# Patient Record
Sex: Female | Born: 1955 | Hispanic: Yes | Marital: Married | Smoking: Never smoker
Health system: Southern US, Community
[De-identification: ages and names within clinical notes are randomized; demographics above are authoritative.]

## PROBLEM LIST (undated history)

## (undated) DIAGNOSIS — E7800 Pure hypercholesterolemia, unspecified: Secondary | ICD-10-CM

## (undated) DIAGNOSIS — H9190 Unspecified hearing loss, unspecified ear: Secondary | ICD-10-CM

## (undated) DIAGNOSIS — E785 Hyperlipidemia, unspecified: Secondary | ICD-10-CM

## (undated) DIAGNOSIS — K2970 Gastritis, unspecified, without bleeding: Secondary | ICD-10-CM

## (undated) DIAGNOSIS — M1990 Unspecified osteoarthritis, unspecified site: Secondary | ICD-10-CM

## (undated) DIAGNOSIS — N390 Urinary tract infection, site not specified: Secondary | ICD-10-CM

## (undated) DIAGNOSIS — I10 Essential (primary) hypertension: Secondary | ICD-10-CM

## (undated) DIAGNOSIS — F419 Anxiety disorder, unspecified: Secondary | ICD-10-CM

## (undated) DIAGNOSIS — E079 Disorder of thyroid, unspecified: Secondary | ICD-10-CM

## (undated) DIAGNOSIS — R001 Bradycardia, unspecified: Secondary | ICD-10-CM

## (undated) HISTORY — DX: Anxiety disorder, unspecified: F41.9

## (undated) HISTORY — PX: CHOLECYSTECTOMY: SHX55

## (undated) HISTORY — DX: Unspecified hearing loss, unspecified ear: H91.90

## (undated) HISTORY — DX: Hyperlipidemia, unspecified: E78.5

## (undated) HISTORY — PX: ROTATOR CUFF REPAIR: SHX139

---

## 2020-06-15 ENCOUNTER — Other Ambulatory Visit: Payer: Self-pay | Admitting: Student

## 2020-06-15 DIAGNOSIS — R42 Dizziness and giddiness: Secondary | ICD-10-CM

## 2020-06-15 DIAGNOSIS — R0989 Other specified symptoms and signs involving the circulatory and respiratory systems: Secondary | ICD-10-CM

## 2020-06-27 ENCOUNTER — Other Ambulatory Visit: Payer: Self-pay

## 2020-07-06 ENCOUNTER — Telehealth: Payer: Self-pay

## 2020-07-06 NOTE — Telephone Encounter (Signed)
NOTES ON FILE FROM [facility]... [phone] SENT REFERRAL TO SCHEDULING

## 2020-07-12 ENCOUNTER — Ambulatory Visit
Admission: RE | Admit: 2020-07-12 | Discharge: 2020-07-12 | Disposition: A | Discharge status: Home / Self Care | Payer: Medicare Other | Source: Ambulatory Visit | Attending: Student | Admitting: Student

## 2020-07-12 DIAGNOSIS — R42 Dizziness and giddiness: Secondary | ICD-10-CM

## 2020-07-12 DIAGNOSIS — R0989 Other specified symptoms and signs involving the circulatory and respiratory systems: Secondary | ICD-10-CM

## 2020-08-16 ENCOUNTER — Other Ambulatory Visit: Payer: Self-pay

## 2020-08-16 ENCOUNTER — Emergency Department (HOSPITAL_BASED_OUTPATIENT_CLINIC_OR_DEPARTMENT_OTHER): Payer: Medicare Other

## 2020-08-16 ENCOUNTER — Emergency Department (HOSPITAL_BASED_OUTPATIENT_CLINIC_OR_DEPARTMENT_OTHER)
Admission: EM | Admit: 2020-08-16 | Discharge: 2020-08-16 | Disposition: A | Discharge status: Home / Self Care | Payer: Medicare Other | Attending: Emergency Medicine | Admitting: Emergency Medicine

## 2020-08-16 ENCOUNTER — Encounter (HOSPITAL_BASED_OUTPATIENT_CLINIC_OR_DEPARTMENT_OTHER): Payer: Self-pay

## 2020-08-16 DIAGNOSIS — Z87440 Personal history of urinary (tract) infections: Secondary | ICD-10-CM | POA: Insufficient documentation

## 2020-08-16 DIAGNOSIS — U071 COVID-19: Secondary | ICD-10-CM | POA: Diagnosis not present

## 2020-08-16 DIAGNOSIS — I10 Essential (primary) hypertension: Secondary | ICD-10-CM | POA: Diagnosis not present

## 2020-08-16 DIAGNOSIS — Z885 Allergy status to narcotic agent status: Secondary | ICD-10-CM | POA: Diagnosis not present

## 2020-08-16 DIAGNOSIS — R079 Chest pain, unspecified: Secondary | ICD-10-CM

## 2020-08-16 DIAGNOSIS — Z9049 Acquired absence of other specified parts of digestive tract: Secondary | ICD-10-CM | POA: Diagnosis not present

## 2020-08-16 DIAGNOSIS — R531 Weakness: Secondary | ICD-10-CM | POA: Insufficient documentation

## 2020-08-16 DIAGNOSIS — R0602 Shortness of breath: Secondary | ICD-10-CM | POA: Diagnosis present

## 2020-08-16 HISTORY — DX: Disorder of thyroid, unspecified: E07.9

## 2020-08-16 HISTORY — DX: Pure hypercholesterolemia, unspecified: E78.00

## 2020-08-16 HISTORY — DX: Essential (primary) hypertension: I10

## 2020-08-16 HISTORY — DX: Gastritis, unspecified, without bleeding: K29.70

## 2020-08-16 HISTORY — DX: Bradycardia, unspecified: R00.1

## 2020-08-16 HISTORY — DX: Urinary tract infection, site not specified: N39.0

## 2020-08-16 HISTORY — DX: Unspecified osteoarthritis, unspecified site: M19.90

## 2020-08-16 LAB — URINALYSIS, MICROSCOPIC (REFLEX)

## 2020-08-16 LAB — BASIC METABOLIC PANEL
Anion gap: 12 (ref 5–15)
BUN: 15 mg/dL (ref 8–23)
CO2: 26 mmol/L (ref 22–32)
Calcium: 8.5 mg/dL — ABNORMAL LOW (ref 8.9–10.3)
Chloride: 97 mmol/L — ABNORMAL LOW (ref 98–111)
Creatinine, Ser: 0.61 mg/dL (ref 0.44–1.00)
GFR, Estimated: >60 mL/min (ref >60)
Glucose, Bld: 103 mg/dL — ABNORMAL HIGH (ref 70–99)
Potassium: 3.1 mmol/L — ABNORMAL LOW (ref 3.5–5.1)
Sodium: 135 mmol/L (ref 135–145)

## 2020-08-16 LAB — CBC
HCT: 39.9 % (ref 36.0–46.0)
Hemoglobin: 13.2 g/dL (ref 12.0–15.0)
MCH: 27.3 pg (ref 26.0–34.0)
MCHC: 33.1 g/dL (ref 30.0–36.0)
MCV: 82.6 fL (ref 80.0–100.0)
Platelets: 248 10*3/uL (ref 150–400)
RBC: 4.83 MIL/uL (ref 3.87–5.11)
RDW: 14.1 % (ref 11.5–15.5)
WBC: 4.8 10*3/uL (ref 4.0–10.5)
nRBC: 0 % (ref 0.0–0.2)

## 2020-08-16 LAB — URINALYSIS, ROUTINE W REFLEX MICROSCOPIC
Bilirubin Urine: NEGATIVE
Glucose, UA: NEGATIVE mg/dL
Hgb urine dipstick: NEGATIVE
Ketones, ur: NEGATIVE mg/dL
Leukocytes,Ua: NEGATIVE
Nitrite: POSITIVE — AB
Protein, ur: NEGATIVE mg/dL
Specific Gravity, Urine: 1.01 (ref 1.005–1.030)
pH: 6 (ref 5.0–8.0)

## 2020-08-16 LAB — TROPONIN I (HIGH SENSITIVITY): Troponin I (High Sensitivity): 10 ng/L (ref <18)

## 2020-08-16 LAB — CBG MONITORING, ED
Glucose-Capillary: 96 mg/dL (ref 70–99)
Glucose-Capillary: 86 mg/dL (ref 70–99)

## 2020-08-16 MED ORDER — SODIUM CHLORIDE 0.9 % IV SOLN
1.0000 g | Freq: Once | INTRAVENOUS | Status: AC
  Administered 2020-08-16: 1 g via INTRAVENOUS
  Filled 2020-08-16: qty 10

## 2020-08-16 MED ORDER — ACETAMINOPHEN 500 MG PO TABS
1000.0000 mg | ORAL_TABLET | Freq: Once | ORAL | Status: AC
  Administered 2020-08-16: 1000 mg via ORAL
  Filled 2020-08-16: qty 2

## 2020-08-16 NOTE — ED Triage Notes (Signed)
Pt covid positive reports that she is having "nervousness" in her abd. Only cough. Pt has had this feeling since Monday and tested positive yesterday. PCP sent Pt here for blood work evaluation prior to prescribing Pt with anxiety medications. Pt reports ongoing dizziness and weakness.

## 2020-08-16 NOTE — Discharge Instructions (Signed)
You can take 600 mg of ibuprofen every 6 hours, you can take 1000 mg of Tylenol every 6 hours, you can alternate these every 3 or you can take them together.  

## 2020-08-16 NOTE — ED Notes (Signed)
Reassess due to being sleepy.  CBG wnl.  BP elevated.  Per daughter, patient tested positive for covid yesterday.

## 2020-08-16 NOTE — ED Provider Notes (Signed)
[facility] EMERGENCY DEPARTMENT Provider Note   CSN: [id number] Arrival date & time: 08/16/20  1314     History Chief Complaint  Patient presents with  . Covid Positive  . Weakness    Courtney Moon is a 65 y.o. female.   Weakness Severity:  Moderate Onset quality:  Gradual Duration:  1 week Timing:  Constant Progression:  Waxing and waning Chronicity:  New Context comment:  Covid positive Relieved by:  Nothing Worsened by:  Nothing Ineffective treatments:  None tried Associated symptoms: anorexia, chest pain, myalgias and shortness of breath   Associated symptoms: no arthralgias, no cough, no diarrhea, no dysuria, no fever, no headaches, no nausea and no vomiting        Past Medical History:  Diagnosis Date  . Arthritis   . Bradycardia   . Chronic UTI   . Gastritis   . Hypercholesteremia   . Hypertension   . Orthostatic hypertension   . Thyroid disorder     There are no problems to display for this patient.   Past Surgical History:  Procedure Laterality Date  . CHOLECYSTECTOMY    . ROTATOR CUFF REPAIR       OB History   No obstetric history on file.     History reviewed. No pertinent family history.  Social History   Tobacco Use  . Smoking status: Never Smoker  . Smokeless tobacco: Never Used  Vaping Use  . Vaping Use: Never used  Substance Use Topics  . Alcohol use: Never  . Drug use: Never    Home Medications Prior to Admission medications   Not on File    Allergies    Oxycodone  Review of Systems   Review of Systems  Constitutional: Positive for activity change and appetite change. Negative for chills and fever.  HENT: Negative for congestion and rhinorrhea.   Respiratory: Positive for shortness of breath. Negative for cough.   Cardiovascular: Positive for chest pain. Negative for palpitations.  Gastrointestinal: Positive for anorexia. Negative for diarrhea, nausea and vomiting.  Genitourinary: Negative for  difficulty urinating and dysuria.  Musculoskeletal: Positive for myalgias. Negative for arthralgias and back pain.  Skin: Negative for rash and wound.  Neurological: Positive for weakness. Negative for light-headedness and headaches.  Psychiatric/Behavioral: The patient is nervous/anxious.     Physical Exam Updated Vital Signs BP (!) 148/82 (BP Location: Left Arm)   Pulse 63   Temp 98.7 F (37.1 C) (Oral)   Resp 15   Ht 5' (1.524 m)   Wt 97.1 kg   SpO2 97%   BMI 41.79 kg/m   Physical Exam Vitals and nursing note reviewed. Exam conducted with a chaperone present.  Constitutional:      General: She is not in acute distress.    Appearance: Normal appearance.  HENT:     Head: Normocephalic and atraumatic.     Nose: No rhinorrhea.  Eyes:     General:        Right eye: No discharge.        Left eye: No discharge.     Conjunctiva/sclera: Conjunctivae normal.  Cardiovascular:     Rate and Rhythm: Normal rate and regular rhythm.     Heart sounds: No murmur heard. No gallop.   Pulmonary:     Effort: Pulmonary effort is normal. No respiratory distress.     Breath sounds: No stridor. No wheezing, rhonchi or rales.  Abdominal:     General: Abdomen is flat. There is no distension.  Palpations: Abdomen is soft.     Tenderness: There is no abdominal tenderness.  Musculoskeletal:        General: No tenderness or signs of injury.  Skin:    General: Skin is warm and dry.  Neurological:     General: No focal deficit present.     Mental Status: She is alert. Mental status is at baseline.     Motor: No weakness.  Psychiatric:        Mood and Affect: Mood normal.        Behavior: Behavior normal.     ED Results / Procedures / Treatments   Labs (all labs ordered are listed, but only abnormal results are displayed) Labs Reviewed  BASIC METABOLIC PANEL - Abnormal; Notable for the following components:      Result Value   Potassium 3.1 (*)    Chloride 97 (*)    Glucose, Bld  103 (*)    Calcium 8.5 (*)    All other components within normal limits  URINALYSIS, ROUTINE W REFLEX MICROSCOPIC - Abnormal; Notable for the following components:   Nitrite POSITIVE (*)    All other components within normal limits  URINALYSIS, MICROSCOPIC (REFLEX) - Abnormal; Notable for the following components:   Bacteria, UA MANY (*)    All other components within normal limits  URINE CULTURE  CBC  CBG MONITORING, ED  CBG MONITORING, ED  TROPONIN I (HIGH SENSITIVITY)  TROPONIN I (HIGH SENSITIVITY)    EKG EKG Interpretation  Date/Time:  Thursday August 16 2020 13:57:31 EST Ventricular Rate:  62 PR Interval:  170 QRS Duration: 86 QT Interval:  448 QTC Calculation: 454 R Axis:   43 Text Interpretation: Normal sinus rhythm T wave abnormality, consider lateral ischemia Abnormal ECG No old tracing to compare Confirmed by Susy Frizzle [phone]) on 08/16/2020 2:55:42 PM   Radiology DG Chest Portable 1 View  Result Date: 08/16/2020 CLINICAL DATA:  Per ED notes: Pt covid positive reports that she is having "nervousness" in her abd. Only cough. Pt has had this feeling since Monday and tested positive yesterday. PCP sent Pt here for blood work evaluation prior to prescribing Pt with anxiety medications. Pt reports ongoing dizziness and weakness. EXAM: PORTABLE CHEST 1 VIEW COMPARISON:  None. FINDINGS: Cardiac silhouette is normal in size. No mediastinal or hilar masses or evidence of adenopathy. Lungs are clear. No evidence of a pleural effusion.  No pneumothorax. Postoperative tapering of the distal right clavicle. No acute skeletal abnormality. IMPRESSION: No active disease. Electronically Signed   By: Amie Portland M.D.   On: 08/16/2020 18:40    Procedures Procedures (including critical care time)  Medications Ordered in ED Medications  acetaminophen (TYLENOL) tablet 1,000 mg (has no administration in time range)  cefTRIAXone (ROCEPHIN) 1 g in sodium chloride 0.9 % 100 mL IVPB  (1 g Intravenous New Bag/Given 08/16/20 1912)    ED Course  I have reviewed the triage vital signs and the nursing notes.  Pertinent labs & imaging results that were available during my care of the patient were reviewed by me and considered in my medical decision making (see chart for details).    MDM Rules/Calculators/A&P                          65 year old female COVID-positive, comes in with intermittent episodes of chest pain.  Not exertional.  Not reproducible on exam.  Likely related to COVID but patient is very anxious called  her doctor her doctor told her she needs to get evaluated emergency room.  Her EKG shows sinus rhythm nonspecific T wave changes.  No prior to compare to.  Shows no acute cardiopulmonary pathology.  Laboratory studies show no significant electrolyte derangements no significant bloodline abnormalities.  Urinalysis shows nitrites and many bacteria.  She does get frequent UTIs.  We will treat with Rocephin and give prescription for Keflex.  Urine culture will be sent.  We will get a one-time troponin as she has had intermittent symptoms for several days.  Low likelihood of coronary artery syndrome the patient is requesting discharge however understands further work-up is needed before she is safe for follow-up with her primary care provider.  Spanish-speaking interpreter was offered but declined by family, they prefer to use family members.  Trop is negative and pt is resting comfortably.  Has mild headache, many other symptoms related to COVID, safer discharge home, strict return precautions given told to follow-up with primary care provider.  Pt is safe for DC home with outpatient follow up. The patient and family agrees with the plan and has no other questions or concerns.  Final Clinical Impression(s) / ED Diagnoses Final diagnoses:  COVID  Chest pain, unspecified type    Rx / DC Orders ED Discharge Orders    None       Sabino Donovan, MD 08/16/20 2014

## 2020-08-19 LAB — URINE CULTURE: Culture: >=100000 — AB

## 2020-08-19 NOTE — Progress Notes (Deleted)
Cardiology Office Note:    Date:  08/19/2020   ID:  Graycen Degan, DOB May 05, 1956, MRN [id number]  PCP:  Hillery Aldo, NP  [facility] Cardiologist:  No primary care provider on file.  [facility] Electrophysiologist:  None   Referring MD: Hillery Aldo, NP    History of Present Illness:    Kailan Carmen is a 65 y.o. female with a hx of HTN, HLD, and recent COVID-19 infection on 08/16/20 who was referred by Hillery Aldo, NP for further evaluation of chest pain.    Past Medical History:  Diagnosis Date  . Arthritis   . Bradycardia   . Chronic UTI   . Gastritis   . Hypercholesteremia   . Hypertension   . Orthostatic hypertension   . Thyroid disorder     Past Surgical History:  Procedure Laterality Date  . CHOLECYSTECTOMY    . ROTATOR CUFF REPAIR      Current Medications: No outpatient medications have been marked as taking for the 08/28/20 encounter (Appointment) with Meriam Sprague, MD.     Allergies:   Oxycodone   Social History   Socioeconomic History  . Marital status: Married    Spouse name: Not on file  . Number of children: Not on file  . Years of education: Not on file  . Highest education level: Not on file  Occupational History  . Not on file  Tobacco Use  . Smoking status: Never Smoker  . Smokeless tobacco: Never Used  Vaping Use  . Vaping Use: Never used  Substance and Sexual Activity  . Alcohol use: Never  . Drug use: Never  . Sexual activity: Not on file  Other Topics Concern  . Not on file  Social History Narrative  . Not on file   Social Determinants of Health   Financial Resource Strain: Not on file  Food Insecurity: Not on file  Transportation Needs: Not on file  Physical Activity: Not on file  Stress: Not on file  Social Connections: Not on file     Family History: The patient's ***family history is not on file.  ROS:   Please see the history of present illness.    *** All other systems reviewed and are  negative.  EKGs/Labs/Other Studies Reviewed:    The following studies were reviewed today: ***  EKG:  EKG is *** ordered today.  The ekg ordered today demonstrates ***  Recent Labs: 08/16/2020: BUN 15; Creatinine, Ser 0.61; Hemoglobin 13.2; Platelets 248; Potassium 3.1; Sodium 135  Recent Lipid Panel No results found for: CHOL, TRIG, HDL, CHOLHDL, VLDL, LDLCALC, LDLDIRECT   Risk Assessment/Calculations:   {Does this patient have ATRIAL FIBRILLATION?[phone]}   Physical Exam:    VS:  There were no vitals taken for this visit.    Wt Readings from Last 3 Encounters:  08/16/20 214 lb (97.1 kg)     GEN: *** Well nourished, well developed in no acute distress HEENT: Normal NECK: No JVD; No carotid bruits LYMPHATICS: No lymphadenopathy CARDIAC: ***RRR, no murmurs, rubs, gallops RESPIRATORY:  Clear to auscultation without rales, wheezing or rhonchi  ABDOMEN: Soft, non-tender, non-distended MUSCULOSKELETAL:  No edema; No deformity  SKIN: Warm and dry NEUROLOGIC:  Alert and oriented x 3 PSYCHIATRIC:  Normal affect   ASSESSMENT:    No diagnosis found. PLAN:    In order of problems listed above:  1. ***   {Are you ordering a CV Procedure (e.g. stress test, cath, DCCV, TEE, etc)?   Press F2        :  [id number]}    Medication Adjustments/Labs and Tests Ordered: Current medicines are reviewed at length with the patient today.  Concerns regarding medicines are outlined above.  No orders of the defined types were placed in this encounter.  No orders of the defined types were placed in this encounter.   There are no Patient Instructions on file for this visit.   Signed, Meriam Sprague, MD  08/19/2020 8:23 AM    [facility]

## 2020-08-20 NOTE — Progress Notes (Signed)
ED Antimicrobial Stewardship Positive Culture Follow Up   Courtney Moon is an 65 y.o. female who presented to [facility] on 08/16/2020 with a chief complaint of  Chief Complaint  Patient presents with   Covid Positive   Weakness    Recent Results (from the past 720 hour(s))  Urine culture     Status: Abnormal   Collection Time: 08/16/20  2:18 PM   Specimen: Urine, Random  Result Value Ref Range Status   Specimen Description   Final    URINE, RANDOM Performed at [facility], [address] [place], [place] [address]    Special Requests   Final    NONE Performed at [facility], [address] [place], [place] [address]    Culture >=100,000 COLONIES/mL ESCHERICHIA COLI (A)  Final   Report Status 08/19/2020 FINAL  Final   Organism ID, Bacteria ESCHERICHIA COLI (A)  Final      Susceptibility   Escherichia coli - MIC*    AMPICILLIN >=32 RESISTANT Resistant     CEFAZOLIN <=4 SENSITIVE Sensitive     CEFEPIME <=0.12 SENSITIVE Sensitive     CEFTRIAXONE <=0.25 SENSITIVE Sensitive     CIPROFLOXACIN <=0.25 SENSITIVE Sensitive     GENTAMICIN >=16 RESISTANT Resistant     IMIPENEM <=0.25 SENSITIVE Sensitive     NITROFURANTOIN <=16 SENSITIVE Sensitive     TRIMETH/SULFA >=320 RESISTANT Resistant     AMPICILLIN/SULBACTAM 4 SENSITIVE Sensitive     PIP/TAZO <=4 SENSITIVE Sensitive     * >=100,000 COLONIES/mL ESCHERICHIA COLI    [x]  Patient discharged originally without antimicrobial agent and treatment may now be indicated  New antibiotic prescription: Flow Manager to call patient. If patient feeling better, no further treatment indicated. If patient still experiencing symptoms, start cephalexin 500mg  PO q6h x 5 days.  ED Provider: , PA-C   [place] 08/20/2020, 8:17 AM Clinical Pharmacist Monday - Friday phone -  [phone] Saturday - Sunday phone - [phone]

## 2020-08-28 ENCOUNTER — Ambulatory Visit: Payer: Medicare Other | Admitting: Cardiology

## 2020-09-24 ENCOUNTER — Ambulatory Visit: Payer: Medicare Other | Admitting: Neurology

## 2020-10-16 ENCOUNTER — Ambulatory Visit: Payer: Medicare Other | Admitting: Cardiology

## 2020-10-25 NOTE — Progress Notes (Unsigned)
Cardiology Office Note:    Date:  10/26/2020   ID:  Courtney Moon, DOB 01/10/1956, MRN [id number]  PCP:  Hillery AldoStowe, Shanna, NP   [facility]  Cardiologist:  No primary care provider on file.  Advanced Practice Provider:  No care team member to display Electrophysiologist:  None    Referring MD: Hillery AldoStowe, Shanna, NP   History of Present Illness:    Courtney Moon is a 65 y.o. female with a hx of HTN, HLD, carotid artery disease and orthostatic hypotension who was referred by Hillery AldoShanna Stowe, NP for further evaluation of chest pain.  The patient had COVID in 08/2020 where she was not behaving like herself and got very agitated and confused and started taking off her clothing. EMS was called and [facility] where ECG was normal, trop negative, CXR without acute pathology. She was discharged home.   Since that time, she has suffered from significant anxiety, SOB and episodes of chest pain. Her chest discomfort and breathing symptoms usually are worsened with anxiety but can occur with exertion as well.  Notably, was followed by Cardiology in [place] where stress test in 2019 was normal; TTE in 2019 with normal LVEF 56%., mild TR. No known coronary disease.   She also has been having worsening dizziness. She underwent carotid ultrasound which showed >70% narrowing in the left ICA. She is planned to see Vascular Surgery. Also has left leg numbness and is planned to see Neurology for further management.   Past Medical History:  Diagnosis Date  . Arthritis   . Bradycardia   . Chronic UTI   . Gastritis   . Hypercholesteremia   . Hypertension   . Orthostatic hypertension   . Thyroid disorder     Past Surgical History:  Procedure Laterality Date  . CHOLECYSTECTOMY    . ROTATOR CUFF REPAIR      Current Medications: Current Meds  Medication Sig  . aspirin 81 MG EC tablet Take 81 mg by mouth every other day.  Marland Kitchen. atorvastatin (LIPITOR) 10 MG tablet Take 10 mg by mouth  daily.  . carvedilol (COREG) 3.125 MG tablet Take 3.125 mg by mouth 2 (two) times daily.  Marland Kitchen. ezetimibe (ZETIA) 10 MG tablet Take 10 mg by mouth daily.  Marland Kitchen. levothyroxine (SYNTHROID) 50 MCG tablet Take 50 mcg by mouth daily.  Marland Kitchen. losartan (COZAAR) 50 MG tablet Take 50 mg by mouth daily.  . pantoprazole (PROTONIX) 40 MG tablet Take 40 mg by mouth daily.     Allergies:   Oxycodone   Social History   Socioeconomic History  . Marital status: Married    Spouse name: Not on file  . Number of children: Not on file  . Years of education: Not on file  . Highest education level: Not on file  Occupational History  . Not on file  Tobacco Use  . Smoking status: Never Smoker  . Smokeless tobacco: Never Used  Vaping Use  . Vaping Use: Never used  Substance and Sexual Activity  . Alcohol use: Never  . Drug use: Never  . Sexual activity: Not on file  Other Topics Concern  . Not on file  Social History Narrative  . Not on file   Social Determinants of Health   Financial Resource Strain: Not on file  Food Insecurity: Not on file  Transportation Needs: Not on file  Physical Activity: Not on file  Stress: Not on file  Social Connections: Not on file  Family History: The patient's family history is not on file.  ROS:   Please see the history of present illness.    Review of Systems  Constitutional: Positive for malaise/fatigue. Negative for fever.  HENT: Negative for hearing loss.   Eyes: Negative for blurred vision and redness.  Respiratory: Positive for cough and shortness of breath.   Cardiovascular: Positive for chest pain. Negative for palpitations, orthopnea, claudication, leg swelling and PND.  Gastrointestinal: Negative for melena, nausea and vomiting.  Genitourinary: Negative for dysuria and flank pain.  Musculoskeletal: Positive for myalgias. Negative for falls.  Neurological: Positive for dizziness, tingling and sensory change. Negative for loss of consciousness.   Endo/Heme/Allergies: Negative for polydipsia.  Psychiatric/Behavioral: The patient is nervous/anxious.     EKGs/Labs/Other Studies Reviewed:    The following studies were reviewed today: Carotid ultrasound 07/12/20: IMPRESSION: Left ICA stenosis estimated at greater than 70%.  Right ICA narrowing, less than 50%.  Normal antegrade vertebral flow bilaterally   EKG:  EKG 08/17/20: NSR with non-specific ST-T wave changes in the lateral leads  Recent Labs: 08/16/2020: BUN 15; Creatinine, Ser 0.61; Hemoglobin 13.2; Platelets 248; Potassium 3.1; Sodium 135  Recent Lipid Panel No results found for: CHOL, TRIG, HDL, CHOLHDL, VLDL, LDLCALC, LDLDIRECT    Physical Exam:    VS:  BP 140/80 (BP Location: Left Arm, Patient Position: Sitting, Cuff Size: Normal)   Pulse 78   Ht 5' (1.524 m)   Wt 210 lb (95.3 kg)   SpO2 98%   BMI 41.01 kg/m     Wt Readings from Last 3 Encounters:  10/26/20 210 lb (95.3 kg)  08/16/20 214 lb (97.1 kg)     GEN:  Well nourished, well developed in no acute distress HEENT: Normal NECK: No JVD; No carotid bruits CARDIAC: RRR, no murmurs, rubs, gallops RESPIRATORY:  Clear to auscultation without rales, wheezing or rhonchi  ABDOMEN: Soft, non-tender, non-distended MUSCULOSKELETAL:  No edema; No deformity  SKIN: Warm and dry NEUROLOGIC:  Alert and oriented x 3 PSYCHIATRIC:  Normal affect   ASSESSMENT:    1. Dyspnea on exertion   2. Chest pain of uncertain etiology   3. Primary hypertension   4. Orthostatic hypotension   5. Left carotid stenosis   6. Left leg numbness   7. Pure hypercholesterolemia    PLAN:    In order of problems listed above:  #Chest Pain: #Dyspnea on Exertion: #Prolonged Covid syndrome: Atypical and developed after COVID infection in January. Has had prior work-up in 2019 with Cardiologist in [place] where stress test in 2019 was normal. TTE with normal BiV function, no significant valve disease. Seems like symptoms mainly  correlate with anxiety but has some shortness of breath with exeriton. Given risk factors and persistence of symptoms, will check TTE. Of note, patient has significant anxiety with stress testing and therefore, will start with calcium score and only pursue further work-up if grossly abnormal. -Check TTE -Check coronary calcium score -Will likely need anxiety medication if plans for stress test or CTA as has not tolerated well in the past  #HTN: Well controlled. -Continue losartan 50mg  daily -Continue coreg 3.125mg  BID  #Orthostatic hypotension: -Continue adequate hydration -Slow position changes -Compression socks  #Left Carotid Stenosis: Recent doppler with >70% left carotid narrowing. -Continue ASA 81mg  daily -Continue atorvastatin 10mg  daily -Continue zetia 10mg  daily  #Left Leg Numbness: #? History of strokes: -Continue ASA, statin and zetia as above -Follow-up with Neuro as scheduled  #HLD: -Continue atorvastatin 10mg  daily -Continue zetia  10mg  daily   Medication Adjustments/Labs and Tests Ordered: Current medicines are reviewed at length with the patient today.  Concerns regarding medicines are outlined above.  Orders Placed This Encounter  Procedures  . CT CARDIAC SCORING  . ECHOCARDIOGRAM COMPLETE   No orders of the defined types were placed in this encounter.   Patient Instructions   Medication Instructions:  Your physician recommends that you continue on your current medications as directed. Please refer to the Current Medication list given to you today.  *If you need a refill on your cardiac medications before your next appointment, please call your pharmacy*   Lab Work: none If you have labs (blood work) drawn today and your tests are completely normal, you will receive your results only by: MyChart Message (if you have MyChart) OR . A paper copy in the mail If you have any lab test that is abnormal or we need to change your treatment, we will call  you to review the results.   Testing/Procedures: Your physician has requested that you have an echocardiogram. Echocardiography is a painless test that uses sound waves to create images of your heart. It provides your doctor with information about the size and shape of your heart and how well your heart's chambers and valves are working. This procedure takes approximately one hour. There are no restrictions for this procedure.   Coronary Calcium Scan A coronary calcium scan is an imaging test used to look for deposits of plaque in the inner lining of the blood vessels of the heart (coronary arteries). Plaque is made up of calcium, protein, and fatty substances. These deposits of plaque can partly clog and narrow the coronary arteries without producing any symptoms or warning signs. This puts a person at risk for a heart attack. This test is recommended for people who are at moderate risk for heart disease. The test can find plaque deposits before symptoms develop. Tell a health care provider about:  Any allergies you have.  All medicines you are taking, including vitamins, herbs, eye drops, creams, and over-the-counter medicines.  Any problems you or family members have had with anesthetic medicines.  Any blood disorders you have.  Any surgeries you have had.  Any medical conditions you have.  Whether you are pregnant or may be pregnant. What are the risks? Generally, this is a safe procedure. However, problems may occur, including:  Harm to a pregnant woman and her unborn baby. This test involves the use of radiation. Radiation exposure can be dangerous to a pregnant woman and her unborn baby. If you are pregnant or think you may be pregnant, you should not have this procedure done.  Slight increase in the risk of cancer. This is because of the radiation involved in the test. What happens before the procedure? Ask your health care provider for any specific instructions on how to  prepare for this procedure. You may be asked to avoid products that contain caffeine, tobacco, or nicotine for 4 hours before the procedure. What happens during the procedure?  You will undress and remove any jewelry from your neck or chest.  You will put on a hospital gown.  Sticky electrodes will be placed on your chest. The electrodes will be connected to an electrocardiogram (ECG) machine to record a tracing of the electrical activity of your heart.  You will lie down on a curved bed that is attached to the CT scanner.  You may be given medicine to slow down your heart rate  so that clear pictures can be created.  You will be moved into the CT scanner, and the CT scanner will take pictures of your heart. During this time, you will be asked to lie still and hold your breath for 2-3 seconds at a time while each picture of your heart is being taken. The procedure may vary among health care providers and hospitals.   What happens after the procedure?  You can get dressed.  You can return to your normal activities.  It is up to you to get the results of your procedure. Ask your health care provider, or the department that is doing the procedure, when your results will be ready. Summary  A coronary calcium scan is an imaging test used to look for deposits of plaque in the inner lining of the blood vessels of the heart (coronary arteries). Plaque is made up of calcium, protein, and fatty substances.  Generally, this is a safe procedure. Tell your health care provider if you are pregnant or may be pregnant.  Ask your health care provider for any specific instructions on how to prepare for this procedure.  A CT scanner will take pictures of your heart.  You can return to your normal activities after the scan is done. This information is not intended to replace advice given to you by your health care provider. Make sure you discuss any questions you have with your health care  provider. Document Revised: 02/08/2019 Document Reviewed: 02/08/2019 Elsevier Patient Education  2021 Elsevier Inc.       [place] de calcio coronario Coronary Calcium Scan Una gammagrafa de calcio coronario es un estudio de diagnstico por imgenes usado para Engineer, manufacturing depsitos de placa en el revestimiento interno de los vasos sanguneos del corazn (arterias coronarias). La placa est compuesta de calcio, protenas y sustancias grasas. Estos depsitos de placa pueden bloquear parcialmente y Art gallery manager las arterias coronarias sin producir sntomas ni seales de advertencia. Esto aumenta el riesgo de una persona de sufrir un infarto de miocardio. Este estudio se recomienda para las personas que tienen un riesgo moderado de sufrir enfermedad cardaca. El estudio puede detectar depsitos de placa antes de que se presenten sntomas. Informe al mdico acerca de lo siguiente:  Cualquier alergia que tenga.  Todos los [employer] [place], incluidos vitaminas, hierbas, gotas oftlmicas, cremas y [address] de [address].  Cualquier problema previo que usted o algn miembro de su familia hayan tenido con los anestsicos.  Cualquier trastorno de la sangre que tenga.  Cirugas a las que se haya sometido.  Cualquier afeccin mdica que tenga.  Si est embarazada o podra estarlo. Cules son los riesgos? En general, se trata de un procedimiento seguro. Sin embargo, pueden ocurrir complicaciones, por ejemplo:  Dao a una mujer embarazada y su beb en gestacin. En este examen se utiliza radiacin. La exposicin a la radiacin puede ser peligrosa para Janne Napoleon embarazada y su beb en gestacin. Si est embarazada o piensa que puede estar Meyers, no se debe Geneticist, molecular.  Leve aumento en el riesgo de cncer. Esto se debe a la radiacin que se Sport and exercise psychologist. Qu ocurre antes del procedimiento? Consulte al mdico si hay instrucciones especficas para prepararse  para este procedimiento. Es posible que le indiquen que evite productos que contengan cafena, tabaco o nicotina durante 4horas antes del procedimiento. Qu ocurre durante el procedimiento?  Deber desvestirse y [employer] las joyas que tenga en el cuello o el [place].  Usar Candice Camp de hospital.  Le colocarn electrodos [employer]. Los electrodos se conectarn a una mquina de electrocardiograma (ECG) para Passenger transport manager un trazado de la actividad elctrica del corazn.  Deber Arsenio Loader en una camilla curva que est conectada al tomgrafo.  Es posible que le administren medicamentos para disminuir la frecuencia cardaca, para que puedan crearse imgenes claras.  Lo llevarn al tomgrafo y el tomgrafo le tomar imgenes del corazn. Durante [employer], se le pedir que se quede quieto y Games developer la respiracin durante 2a 3segundos por vez mientras se toma cada imagen del corazn. El procedimiento puede variar segn el mdico y el hospital.   Ladell Heads ocurre despus del procedimiento?  Puede vestirse.  Puede retomar sus actividades habituales.  Es su responsabilidad retirar [employer] del procedimiento. Pregntele a su mdico, o a un miembro del personal del departamento donde se realice el procedimiento, cundo estarn [employer]. Resumen  Una gammagrafa de calcio coronario es un estudio de diagnstico por imgenes usado para Engineer, manufacturing depsitos de placa en el revestimiento interno de los vasos sanguneos del corazn (arterias coronarias). La placa est compuesta de calcio, protenas y sustancias grasas.  En general, se trata de un procedimiento seguro. Informe al mdico si est embarazada o puede estarlo.  Consulte al mdico si hay instrucciones especficas para prepararse para este procedimiento.  Un tomgrafo toma imgenes del corazn.  Puede retomar sus actividades normales despus de que se realiza [place]. Esta informacin no tiene Theme park manager el  consejo del mdico. Asegrese de hacerle al mdico cualquier pregunta que tenga. Document Revised: 04/06/2019 Document Reviewed: 04/06/2019 Elsevier Patient Education  2021 Elsevier Inc.    Follow-Up: At [facility], you and your health needs are our priority.  As part of our continuing mission to provide you with exceptional heart care, we have created designated Provider Care Teams.  These Care Teams include your primary Cardiologist (physician) and Advanced Practice Providers (APPs -  Physician Assistants and Nurse Practitioners) who all work together to provide you with the care you need, when you need it.  We recommend signing up for the patient portal called "MyChart".  Sign up information is provided on this After Visit Summary.  MyChart is used to connect with patients for Virtual Visits (Telemedicine).  Patients are able to view lab/test results, encounter notes, upcoming appointments, etc.  Non-urgent messages can be sent to your provider as well.   To learn more about what you can do with MyChart, go to [email or web address].    Your next appointment:   6 month(s)  The format for your next appointment:   In Person  Provider:   You will see one of the following Advanced Practice Providers on your designated Care Team:    Tereso Newcomer, PA-C  Chelsea Aus, [place]     Other Instructions      Signed, Meriam Sprague, MD  10/26/2020 5:01 PM    [facility]

## 2020-10-26 ENCOUNTER — Encounter: Payer: Self-pay | Admitting: Cardiology

## 2020-10-26 ENCOUNTER — Other Ambulatory Visit: Payer: Self-pay

## 2020-10-26 ENCOUNTER — Ambulatory Visit: Payer: Medicare Other | Admitting: Cardiology

## 2020-10-26 VITALS — BP 140/80 | HR 78 | Ht 60.0 in | Wt 210.0 lb

## 2020-10-26 DIAGNOSIS — R0600 Dyspnea, unspecified: Secondary | ICD-10-CM | POA: Diagnosis not present

## 2020-10-26 DIAGNOSIS — R079 Chest pain, unspecified: Secondary | ICD-10-CM | POA: Diagnosis not present

## 2020-10-26 DIAGNOSIS — R0609 Other forms of dyspnea: Secondary | ICD-10-CM

## 2020-10-26 DIAGNOSIS — I6522 Occlusion and stenosis of left carotid artery: Secondary | ICD-10-CM

## 2020-10-26 DIAGNOSIS — I951 Orthostatic hypotension: Secondary | ICD-10-CM | POA: Diagnosis not present

## 2020-10-26 DIAGNOSIS — I10 Essential (primary) hypertension: Secondary | ICD-10-CM

## 2020-10-26 DIAGNOSIS — E7800 Pure hypercholesterolemia, unspecified: Secondary | ICD-10-CM

## 2020-10-26 DIAGNOSIS — R200 Anesthesia of skin: Secondary | ICD-10-CM

## 2020-10-26 NOTE — Patient Instructions (Signed)
Medication Instructions:  Your physician recommends that you continue on your current medications as directed. Please refer to the Current Medication list given to you today.  *If you need a refill on your cardiac medications before your next appointment, please call your pharmacy*   Lab Work: none If you have labs (blood work) drawn today and your tests are completely normal, you will receive your results only by: Marland Kitchen MyChart Message (if you have MyChart) OR . A paper copy in the mail If you have any lab test that is abnormal or we need to change your treatment, we will call you to review the results.   Testing/Procedures: Your physician has requested that you have an echocardiogram. Echocardiography is a painless test that uses sound waves to create images of your heart. It provides your doctor with information about the size and shape of your heart and how well your heart's chambers and valves are working. This procedure takes approximately one hour. There are no restrictions for this procedure.   Coronary Calcium Scan A coronary calcium scan is an imaging test used to look for deposits of plaque in the inner lining of the blood vessels of the heart (coronary arteries). Plaque is made up of calcium, protein, and fatty substances. These deposits of plaque can partly clog and narrow the coronary arteries without producing any symptoms or warning signs. This puts a person at risk for a heart attack. This test is recommended for people who are at moderate risk for heart disease. The test can find plaque deposits before symptoms develop. Tell a health care provider about:  Any allergies you have.  All medicines you are taking, including vitamins, herbs, eye drops, creams, and over-the-counter medicines.  Any problems you or family members have had with anesthetic medicines.  Any blood disorders you have.  Any surgeries you have had.  Any medical conditions you have.  Whether you are  pregnant or may be pregnant. What are the risks? Generally, this is a safe procedure. However, problems may occur, including:  Harm to a pregnant woman and her unborn baby. This test involves the use of radiation. Radiation exposure can be dangerous to a pregnant woman and her unborn baby. If you are pregnant or think you may be pregnant, you should not have this procedure done.  Slight increase in the risk of cancer. This is because of the radiation involved in the test. What happens before the procedure? Ask your health care provider for any specific instructions on how to prepare for this procedure. You may be asked to avoid products that contain caffeine, tobacco, or nicotine for 4 hours before the procedure. What happens during the procedure?  You will undress and remove any jewelry from your neck or chest.  You will put on a hospital gown.  Sticky electrodes will be placed on your chest. The electrodes will be connected to an electrocardiogram (ECG) machine to record a tracing of the electrical activity of your heart.  You will lie down on a curved bed that is attached to the CT scanner.  You may be given medicine to slow down your heart rate so that clear pictures can be created.  You will be moved into the CT scanner, and the CT scanner will take pictures of your heart. During this time, you will be asked to lie still and hold your breath for 2-3 seconds at a time while each picture of your heart is being taken. The procedure may vary among health care  providers and hospitals.   What happens after the procedure?  You can get dressed.  You can return to your normal activities.  It is up to you to get the results of your procedure. Ask your health care provider, or the department that is doing the procedure, when your results will be ready. Summary  A coronary calcium scan is an imaging test used to look for deposits of plaque in the inner lining of the blood vessels of the  heart (coronary arteries). Plaque is made up of calcium, protein, and fatty substances.  Generally, this is a safe procedure. Tell your health care provider if you are pregnant or may be pregnant.  Ask your health care provider for any specific instructions on how to prepare for this procedure.  A CT scanner will take pictures of your heart.  You can return to your normal activities after the scan is done. This information is not intended to replace advice given to you by your health care provider. Make sure you discuss any questions you have with your health care provider. Document Revised: 02/08/2019 Document Reviewed: 02/08/2019 Elsevier Patient Education  2021 Elsevier Inc.       [place] de calcio coronario Coronary Calcium Scan Una gammagrafa de calcio coronario es un estudio de diagnstico por imgenes usado para Engineer, manufacturing depsitos de placa en el revestimiento interno de los vasos sanguneos del corazn (arterias coronarias). La placa est compuesta de calcio, protenas y sustancias grasas. Estos depsitos de placa pueden bloquear parcialmente y Art gallery manager las arterias coronarias sin producir sntomas ni seales de advertencia. Esto aumenta el riesgo de una persona de sufrir un infarto de miocardio. Este estudio se recomienda para las personas que tienen un riesgo moderado de sufrir enfermedad cardaca. El estudio puede detectar depsitos de placa antes de que se presenten sntomas. Informe al mdico acerca de lo siguiente:  Cualquier alergia que tenga.  Todos los [employer] [place], incluidos vitaminas, hierbas, gotas oftlmicas, cremas y [address] de [address].  Cualquier problema previo que usted o algn miembro de su familia hayan tenido con los anestsicos.  Cualquier trastorno de la sangre que tenga.  Cirugas a las que se haya sometido.  Cualquier afeccin mdica que tenga.  Si est embarazada o podra estarlo. Cules son los riesgos? En general, se  trata de un procedimiento seguro. Sin embargo, pueden ocurrir complicaciones, por ejemplo:  Dao a una mujer embarazada y su beb en gestacin. En este examen se utiliza radiacin. La exposicin a la radiacin puede ser peligrosa para Janne Napoleon embarazada y su beb en gestacin. Si est embarazada o piensa que puede estar [place], no se debe Geneticist, molecular.  Leve aumento en el riesgo de cncer. Esto se debe a la radiacin que se Sport and exercise psychologist. Qu ocurre antes del procedimiento? Consulte al mdico si hay instrucciones especficas para prepararse para este procedimiento. Es posible que le indiquen que evite productos que contengan cafena, tabaco o nicotina durante 4horas antes del procedimiento. Qu ocurre durante el procedimiento?  Deber desvestirse y [employer] las joyas que tenga en el cuello o el [place].  Usar Candice Camp de hospital.  Conley Rolls colocarn electrodos [employer] en el pecho. Los electrodos se conectarn a una mquina de electrocardiograma (ECG) para Passenger transport manager un trazado de la actividad elctrica del corazn.  Deber Arsenio Loader en una camilla curva que est conectada al tomgrafo.  Es posible que le administren medicamentos para disminuir la frecuencia cardaca, para que puedan crearse imgenes claras.  Lo llevarn al  tomgrafo y Clinical research associate tomar imgenes del corazn. Durante [employer], se le pedir que se quede quieto y Games developer la respiracin durante 2a 3segundos por vez mientras se toma cada imagen del corazn. El procedimiento puede variar segn el mdico y el hospital.   Ladell Heads ocurre despus del procedimiento?  Puede vestirse.  Puede retomar sus actividades habituales.  Es su responsabilidad retirar [employer] del procedimiento. Pregntele a su mdico, o a un miembro del personal del departamento donde se realice el procedimiento, cundo estarn [employer]. Resumen  Una gammagrafa de calcio coronario es un estudio de  diagnstico por imgenes usado para Engineer, manufacturing depsitos de placa en el revestimiento interno de los vasos sanguneos del corazn (arterias coronarias). La placa est compuesta de calcio, protenas y sustancias grasas.  En general, se trata de un procedimiento seguro. Informe al mdico si est embarazada o puede estarlo.  Consulte al mdico si hay instrucciones especficas para prepararse para este procedimiento.  Un tomgrafo toma imgenes del corazn.  Puede retomar sus actividades normales despus de que se realiza [place]. Esta informacin no tiene Theme park manager el consejo del mdico. Asegrese de hacerle al mdico cualquier pregunta que tenga. Document Revised: 04/06/2019 Document Reviewed: 04/06/2019 Elsevier Patient Education  2021 Elsevier Inc.    Follow-Up: At [facility], you and your health needs are our priority.  As part of our continuing mission to provide you with exceptional heart care, we have created designated Provider Care Teams.  These Care Teams include your primary Cardiologist (physician) and Advanced Practice Providers (APPs -  Physician Assistants and Nurse Practitioners) who all work together to provide you with the care you need, when you need it.  We recommend signing up for the patient portal called "MyChart".  Sign up information is provided on this After Visit Summary.  MyChart is used to connect with patients for Virtual Visits (Telemedicine).  Patients are able to view lab/test results, encounter notes, upcoming appointments, etc.  Non-urgent messages can be sent to your provider as well.   To learn more about what you can do with MyChart, go to [email or web address].    Your next appointment:   6 month(s)  The format for your next appointment:   In Person  Provider:   You will see one of the following Advanced Practice Providers on your designated Care Team:    Tereso Newcomer, PA-C  Chelsea Aus, [place]     Other Instructions

## 2020-11-05 ENCOUNTER — Ambulatory Visit: Payer: Medicare Other | Admitting: Neurology

## 2020-12-04 ENCOUNTER — Other Ambulatory Visit: Payer: Self-pay

## 2020-12-04 DIAGNOSIS — I6529 Occlusion and stenosis of unspecified carotid artery: Secondary | ICD-10-CM

## 2020-12-17 ENCOUNTER — Telehealth (HOSPITAL_COMMUNITY): Payer: Self-pay | Admitting: Cardiology

## 2020-12-17 ENCOUNTER — Other Ambulatory Visit (HOSPITAL_COMMUNITY): Payer: Medicare Other

## 2020-12-17 ENCOUNTER — Other Ambulatory Visit: Payer: Self-pay

## 2020-12-17 NOTE — Telephone Encounter (Signed)
Patient called and cancelled echo and CT for reason below:  12/17/2020 10:14 AM JG:GEZMO, ASHLI J  Cancel Rsn: Patient (pt is not feeling well she will call back to reschedule)  Order will be removed from the Echo Wq and when patient calls back to reschedule we will reinstate the order. Thank you.

## 2020-12-18 ENCOUNTER — Encounter (HOSPITAL_COMMUNITY): Payer: Medicare Other

## 2020-12-18 ENCOUNTER — Encounter: Payer: Medicare Other | Admitting: Vascular Surgery

## 2021-01-03 ENCOUNTER — Ambulatory Visit: Payer: Medicare Other | Admitting: Neurology

## 2021-01-03 ENCOUNTER — Encounter: Payer: Self-pay | Admitting: Neurology

## 2021-01-03 VITALS — BP 202/101 | HR 95 | Ht 60.0 in | Wt 206.0 lb

## 2021-01-03 DIAGNOSIS — I119 Hypertensive heart disease without heart failure: Secondary | ICD-10-CM | POA: Diagnosis not present

## 2021-01-03 DIAGNOSIS — I2510 Atherosclerotic heart disease of native coronary artery without angina pectoris: Secondary | ICD-10-CM | POA: Diagnosis not present

## 2021-01-03 DIAGNOSIS — H9313 Tinnitus, bilateral: Secondary | ICD-10-CM

## 2021-01-03 DIAGNOSIS — G441 Vascular headache, not elsewhere classified: Secondary | ICD-10-CM | POA: Diagnosis not present

## 2021-01-03 DIAGNOSIS — R42 Dizziness and giddiness: Secondary | ICD-10-CM | POA: Insufficient documentation

## 2021-01-03 DIAGNOSIS — R9389 Abnormal findings on diagnostic imaging of other specified body structures: Secondary | ICD-10-CM | POA: Insufficient documentation

## 2021-01-03 NOTE — Progress Notes (Signed)
CLINIC    Provider:  Melvyn Novas, MD  Primary Care Physician:  Hillery Aldo, NP [address] [place] [address]     Referring Provider:   Hillery Aldo, Np [address] [place],  [place] [address]          Chief Complaint according to patient   Patient presents with:    . New Patient (Initial Visit)     Pt with daughter, rm 57. She is translating for her. Pt referred here for chronic dizziness concerns. 1/2 yrs has become more constant. She has had several falls. They completed carotid US but no results yet. She had covid in January  and since feels dizziness and tinnitus  have worsened along with .  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  Mentioned a Rotator cuff tear repair and had postsurgical compications.       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  Mentioned a Rotator cuff tear repair and had postsurgical complications- in 2010 . vertigo, vestibular component when laying down.  In December she had carotid doppler, and she was told there were abnormalities.  Had a stress test by bike, couldn't complete - changed to medication induced stress test.     Family medical history:  mother and father, aunt , sister with DM,  Obesity. Daughter with migraines. Maternal history of thyroid disease.  Social history: Patient is retired in 2020  from - used to live in [place]. 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 Now lives in a  household with  persons/ alone. Family status is married, living with her daughter and son I Social worker and grandchildren.  Tobacco use-never .  ETOH use; none ,  Caffeine intake in form of Coffee( I cup in AM  ) Soda( /) Tea ( green on [place]). Regular exercise in form of Walking, limited by SOB and chest pain.            Review of Systems: Out of a complete 14 system review, the patient complains of only the following symptoms, and all other reviewed systems are negative.:  See above.   Social History   Socioeconomic History  . Marital status: Married    Spouse name: Not on file  . Number of children: Not on file  . Years of education: Not on file  . Highest education level: Not on file  Occupational History  . Not on file  Tobacco Use  . Smoking status: Never Smoker  . Smokeless tobacco: Never Used  Vaping Use  . Vaping Use: Never used  Substance and Sexual Activity  . Alcohol use: Never  . Drug use: Never  . Sexual activity: Not on file  Other Topics Concern  . Not on file  Social History Narrative  . Not on file   Social Determinants of Health   Financial Resource Strain: Not on file  Food Insecurity: Not on file  Transportation Needs: Not on file  Physical Activity: Not  on file  Stress: Not on file  Social Connections: Not on file    No family history on file.  Past Medical History:  Diagnosis Date  . Arthritis   . Bradycardia   . Chronic UTI   . Gastritis   . Hypercholesteremia   . Hypertension   . Orthostatic hypertension   . Thyroid disorder     Past Surgical History:  Procedure Laterality Date  . CHOLECYSTECTOMY    . ROTATOR CUFF REPAIR       Current Outpatient Medications on File Prior to Visit  Medication Sig Dispense Refill  . atorvastatin (LIPITOR) 10 MG tablet Take 10 mg by mouth daily.    . carvedilol (COREG) 3.125 MG tablet Take 3.125 mg by mouth 2 (two) times daily.    . cyclobenzaprine (FLEXERIL) 5 MG tablet Take 5 mg by mouth  daily as needed for muscle spasms.    Marland Kitchen. ezetimibe (ZETIA) 10 MG tablet Take 10 mg by mouth daily.    . hydrOXYzine (ATARAX/VISTARIL) 25 MG tablet Take 12.5-25 mg by mouth 3 (three) times daily as needed.    Marland Kitchen. losartan (COZAAR) 50 MG tablet Take 50 mg by mouth daily.    . meclizine (ANTIVERT) 12.5 MG tablet Take 12.5 mg by mouth 2 (two) times daily.    . pantoprazole (PROTONIX) 40 MG tablet Take 40 mg by mouth daily.     No current facility-administered medications on file prior to visit.    Allergies  Allergen Reactions  . Oxycodone     Anxiety/Aggiationwith narcotics    Physical exam:  Today's Vitals   01/03/21 1406  BP: (!) 202/101  Pulse: 95  Weight: 206 lb (93.4 kg)  Height: 5' (1.524 m)   Body mass index is 40.23 kg/m.   Wt Readings from Last 3 Encounters:  01/03/21 206 lb (93.4 kg)  10/26/20 210 lb (95.3 kg)  08/16/20 214 lb (97.1 kg)     Ht Readings from Last 3 Encounters:  01/03/21 5' (1.524 m)  10/26/20 5' (1.524 m)  08/16/20 5' (1.524 m)       ORTHOSTATIC BP. The patient's supine blood pressure was 202 mmHg over 102 with a heart rate of 95 bpm regular rhythm.  Seated blood pressure 162/90 mmHg with a heart rate of 84  Standing 156/84 mmHg with a heart rate of 72.  What is truly abnormal is a large drop from the extreme supine blood pressure to the seated position and that the heart rate is decreased along with her blood pressure.  She reported vertigo when in supine position and pulsatory tinnitus .   Courtney Moon     General: The patient is awake, alert and appears not in acute distress. The patient is well groomed. Head: Normocephalic, atraumatic. Neck is supple.   Mallampati  2 plus, almost 3. Loud snorer.  neck circumference:16 inches . Nasal airflow patent.   Retrognathia is not seen.  Cardiovascular:  Regular rate and cardiac rhythm by pulse, possible split s1-   without distended neck veins. Respiratory: Lungs are clear to  auscultation.  Skin:  With evidence of ankle edema.- bilateral.  Trunk: The patient's posture is erect.   Neurologic exam : The patient is awake and alert, oriented to place and time.   Memory subjective described as intact.  Attention span & concentration ability appears normal.  Speech is fluent,  without  dysarthria, dysphonia or aphasia.  Mood and affect are appropriate.   Cranial nerves: no loss of smell  or taste reported  Pupils are equal and briskly reactive to light. Funduscopic exam deferred. .  Extraocular movements in vertical and horizontal planes were intact and without nystagmus. No Diplopia. Visual fields by finger perimetry are intact. Hearing was intact to soft voice and finger rubbing. Tinnitus is present in both ears, right louder, Rinne Weber lateralized to the left- bone conduction.    Facial sensation intact to fine touch.  Facial motor strength is symmetric and tongue and uvula move midline.  Neck ROM : rotation, tilt and flexion extension were normal for age and shoulder shrug was asymmetrical.- injuries to the right shoulder.    Motor exam:  Symmetric bulk, tone and ROM symmetric grip strength .   Sensory:  Fine touch, pinprick and vibration were tested  and  normal.  Proprioception tested in the upper extremities was normal.   Coordination: Rapid alternating movements in the fingers/hands were of normal speed.  The Finger-to-nose maneuver was intact without evidence of ataxia, dysmetria or tremor.   Gait and station: Patient could rise unassisted from a seated position, walked without assistive device.  Stance is of wider base and the patient turned with 4 steps.  Toe and heel walk were deferred.  Deep tendon reflexes: in the  upper and lower extremities are symmetric and intact.  Babinski response was deferred .      After spending a total time of  46  minutes face to face and additional time for physical and neurologic examination, review of laboratory  studies,  personal review of imaging studies, reports and results of other testing and review of referral information / records as far as provided in visit, I have established the following assessments:  I was able to review the laboratory results the patient has a normal glomerular filtration rate of 77 mL/min she had normal electrolytes sodium potassium chloride calcium protein albumin globulin AST and ALT were all in normal range bilirubin was normal, CBC and differential were considered normal hepatitis C antibody was not reactive.  At this time I strongly suspect that her dizziness and vertigo is related to the uncontrolled hypertension.  She has had a history of sinus surgery recurrent sinus infections but none recently.  Tinnitus and dizziness were present soon after she underwent a shoulder surgery in 2010 for an injury she had suffered in a fall in 2009.  This affected the right shoulder.  She just describes a vertigo sensation when lying down, a pulsatile tinnitus and vascular headaches.  I do not think these are complicated neurologic headaches I think they are related to the increased blood pressure and need to be more aggressively treated.   I could not review the Echo  test results in EPIC.    IMPRESSION: 1. Systemic arterial hypertension. 2. No evidence of significant peripheral arterial disease. Jarvis Morgan, MD, RPVI  [facility]  IMPRESSION: Left ICA stenosis estimated at greater than 70%. Right ICA narrowing, less than 50% . Normal antegrade vertebral flow bilaterally Electronically Signed   By: Judie Petit.  Shick M.D.   On: 07/12/2020 16:09    1)  THIS is vertigo and tinnitus related to HTN, uncontrolled.  2)  Unclear if the patient is diabetic. 3)   Very likely has OSA- snoring and nocturia. 4)   carotid bruit   Higher risk category for CVA .     My Plan is to proceed with: HST  1) PCP needs to control  hypertension, check for renal artery stenosis.  2) HA and vertigo may resolve with BP control   3) no evidence of strokes by physical examination.  I will not order MRI or CT now, she is very claustrophobic. She needs BP control aggessively-   I would like to thank Hillery Aldo, NP [address] [place],  [place] [address] for allowing me to meet with and to take care of this pleasant patient.   In short, Courtney Moon is presenting with untreated OSA, vascular headaches, HTN and vertigo. Orthostatic drop in HTN , and drop in heart rate.  I plan to follow up either personally or through our NP within 2-4  month.   CC: I will share my notes with PCP   Electronically signed by: Melvyn Novas, MD 01/03/2021 2:11 PM  [facility] Board certified by The [employer] of Sleep Medicine and Diplomate of the [employer] of Sleep Medicine. Board certified In Neurology through the ABPN, Fellow of the [employer] of Neurology. Medical Director of [facility].

## 2021-01-15 ENCOUNTER — Telehealth: Payer: Self-pay | Admitting: Neurology

## 2021-01-15 NOTE — Telephone Encounter (Signed)
Noted! Thank you

## 2021-01-15 NOTE — Telephone Encounter (Signed)
Pt's daughter Shawndell Varas  on [place] called stating that the pt will not be able to make tomorrows appt. Please advise.

## 2021-01-28 ENCOUNTER — Inpatient Hospital Stay: Admission: RE | Admit: 2021-01-28 | Payer: Medicare Other | Source: Ambulatory Visit

## 2021-01-28 ENCOUNTER — Other Ambulatory Visit (HOSPITAL_COMMUNITY): Payer: Medicare Other

## 2021-02-01 ENCOUNTER — Other Ambulatory Visit: Payer: Self-pay | Admitting: Student

## 2021-02-06 ENCOUNTER — Other Ambulatory Visit: Payer: Self-pay | Admitting: Student

## 2021-02-13 ENCOUNTER — Ambulatory Visit (HOSPITAL_COMMUNITY): Payer: Medicare Other | Attending: Cardiology

## 2021-02-13 ENCOUNTER — Ambulatory Visit (INDEPENDENT_AMBULATORY_CARE_PROVIDER_SITE_OTHER)
Admission: RE | Admit: 2021-02-13 | Discharge: 2021-02-13 | Disposition: A | Discharge status: Home / Self Care | Payer: Self-pay | Source: Ambulatory Visit | Attending: Cardiology | Admitting: Cardiology

## 2021-02-13 ENCOUNTER — Other Ambulatory Visit: Payer: Self-pay

## 2021-02-13 DIAGNOSIS — R0600 Dyspnea, unspecified: Secondary | ICD-10-CM | POA: Insufficient documentation

## 2021-02-13 DIAGNOSIS — I2510 Atherosclerotic heart disease of native coronary artery without angina pectoris: Secondary | ICD-10-CM

## 2021-02-13 DIAGNOSIS — R0609 Other forms of dyspnea: Secondary | ICD-10-CM

## 2021-02-13 LAB — ECHOCARDIOGRAM COMPLETE
Area-P 1/2: 2.29 cm2
S' Lateral: 1.9 cm

## 2021-02-15 ENCOUNTER — Telehealth: Payer: Self-pay | Admitting: *Deleted

## 2021-02-15 DIAGNOSIS — I77819 Aortic ectasia, unspecified site: Secondary | ICD-10-CM

## 2021-02-15 DIAGNOSIS — Z79899 Other long term (current) drug therapy: Secondary | ICD-10-CM

## 2021-02-15 DIAGNOSIS — E7800 Pure hypercholesterolemia, unspecified: Secondary | ICD-10-CM

## 2021-02-15 DIAGNOSIS — E785 Hyperlipidemia, unspecified: Secondary | ICD-10-CM

## 2021-02-15 DIAGNOSIS — Z0189 Encounter for other specified special examinations: Secondary | ICD-10-CM

## 2021-02-15 DIAGNOSIS — I119 Hypertensive heart disease without heart failure: Secondary | ICD-10-CM

## 2021-02-15 NOTE — Telephone Encounter (Signed)
Contacted the pts daughter Francesca Jewett (on [place]) via spanish interpreter with [employer] Reuel Boom ID #[id number], about pts Calcium Score/echo results and recommendations per Dr. Shari Prows. Repeat lipids are scheduled for the pt in 6 weeks on 03/29/21 here at the office.  Interpreter made the daughter aware that pt should come fasting to this lab appt.  CT Angio Chest Aorta was ordered for the pt to have done in one year and daughter made aware of this via spanish interpreter.  Daughter is aware that I will send a message to our CT Scheduler to call her back and schedule this appt for the pt in one year.  Daughter is aware pt needs to maintain good BP control and continue her current med regimen. Daughter verbalized understanding and agrees with this plan, via spanish interpreter Reuel Boom.

## 2021-02-15 NOTE — Telephone Encounter (Signed)
-----   Message from Meriam Sprague, MD sent at 02/14/2021  1:45 PM EDT ----- Echo looks good with normal pumping function and no significant valve disease. 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 We just need to make sure her blood pressure is controlled as well.

## 2021-02-15 NOTE — Telephone Encounter (Signed)
Meriam Sprague, MD  Loa Socks, LPN Her calcium score is 11.9 which is the 67% for age, race and sex matched controls. Overall, this is not too elevated. She should continue the atorvastatin at this time with repeat cholesterol in 6 weeks to make sure herLDL is at goal <70.

## 2021-03-06 ENCOUNTER — Other Ambulatory Visit: Payer: Self-pay | Admitting: Student

## 2021-03-06 DIAGNOSIS — I10 Essential (primary) hypertension: Secondary | ICD-10-CM

## 2021-03-15 ENCOUNTER — Other Ambulatory Visit: Payer: Medicare Other

## 2021-03-22 ENCOUNTER — Ambulatory Visit
Admission: RE | Admit: 2021-03-22 | Discharge: 2021-03-22 | Disposition: A | Discharge status: Home / Self Care | Payer: Medicare Other | Source: Ambulatory Visit | Attending: Student | Admitting: Student

## 2021-03-22 DIAGNOSIS — I10 Essential (primary) hypertension: Secondary | ICD-10-CM

## 2021-03-29 ENCOUNTER — Other Ambulatory Visit: Payer: Medicare Other

## 2021-04-11 DIAGNOSIS — I6522 Occlusion and stenosis of left carotid artery: Secondary | ICD-10-CM | POA: Insufficient documentation

## 2021-04-22 ENCOUNTER — Other Ambulatory Visit: Payer: Self-pay | Admitting: Student

## 2021-04-26 ENCOUNTER — Other Ambulatory Visit: Payer: Medicare Other

## 2021-05-08 ENCOUNTER — Other Ambulatory Visit: Payer: Self-pay | Admitting: Student

## 2021-05-08 DIAGNOSIS — Z1231 Encounter for screening mammogram for malignant neoplasm of breast: Secondary | ICD-10-CM

## 2021-05-24 ENCOUNTER — Other Ambulatory Visit: Payer: Medicare Other | Admitting: *Deleted

## 2021-05-24 ENCOUNTER — Other Ambulatory Visit: Payer: Self-pay

## 2021-05-24 DIAGNOSIS — E785 Hyperlipidemia, unspecified: Secondary | ICD-10-CM

## 2021-05-24 DIAGNOSIS — I119 Hypertensive heart disease without heart failure: Secondary | ICD-10-CM

## 2021-05-24 DIAGNOSIS — E7800 Pure hypercholesterolemia, unspecified: Secondary | ICD-10-CM

## 2021-05-24 DIAGNOSIS — Z79899 Other long term (current) drug therapy: Secondary | ICD-10-CM

## 2021-05-24 DIAGNOSIS — Z0189 Encounter for other specified special examinations: Secondary | ICD-10-CM

## 2021-05-24 DIAGNOSIS — I77819 Aortic ectasia, unspecified site: Secondary | ICD-10-CM

## 2021-05-24 LAB — LIPID PANEL
Chol/HDL Ratio: 2.5 ratio (ref 0.0–4.4)
Cholesterol, Total: 113 mg/dL (ref 100–199)
HDL: 46 mg/dL (ref >39)
LDL Chol Calc (NIH): 50 mg/dL (ref 0–99)
Triglycerides: 87 mg/dL (ref 0–149)
VLDL Cholesterol Cal: 17 mg/dL (ref 5–40)

## 2021-06-14 ENCOUNTER — Ambulatory Visit: Payer: Medicare Other

## 2021-07-16 ENCOUNTER — Ambulatory Visit: Payer: Medicare Other

## 2021-09-20 ENCOUNTER — Ambulatory Visit: Payer: Medicare Other

## 2021-10-18 ENCOUNTER — Ambulatory Visit: Payer: Medicare Other

## 2021-11-22 ENCOUNTER — Ambulatory Visit: Payer: Medicare Other

## 2021-12-12 ENCOUNTER — Ambulatory Visit: Payer: Medicare Other

## 2021-12-20 LAB — LAB REPORT - SCANNED
Albumin, Urine POC: 1.1
Creatinine, Random Urine: 38
EGFR (Non-African Amer.): 80

## 2021-12-27 ENCOUNTER — Ambulatory Visit: Payer: Medicare Other

## 2022-01-09 ENCOUNTER — Ambulatory Visit: Payer: Medicare Other

## 2022-01-31 ENCOUNTER — Other Ambulatory Visit: Payer: Medicare Other

## 2022-02-01 LAB — BASIC METABOLIC PANEL
BUN/Creatinine Ratio: 29 — ABNORMAL HIGH (ref 12–28)
BUN: 23 mg/dL (ref 8–27)
CO2: 25 mmol/L (ref 20–29)
Calcium: 9.7 mg/dL (ref 8.7–10.3)
Chloride: 104 mmol/L (ref 96–106)
Creatinine, Ser: 0.79 mg/dL (ref 0.57–1.00)
Glucose: 124 mg/dL — ABNORMAL HIGH (ref 70–99)
Potassium: 4.2 mmol/L (ref 3.5–5.2)
Sodium: 143 mmol/L (ref 134–144)
eGFR: 81 mL/min/{1.73_m2} (ref >59)

## 2022-02-06 ENCOUNTER — Telehealth: Payer: Self-pay | Admitting: Cardiology

## 2022-02-06 NOTE — Telephone Encounter (Signed)
Called patient's daughter to get her set up for her early CT and when I asked if Ms Dant is claustrophobic she said she is and would need help with that, whether that be a support person or possibly other methods. Can you give her daughter a call @ [phone] to figure out what options they have?

## 2022-02-08 ENCOUNTER — Ambulatory Visit (HOSPITAL_BASED_OUTPATIENT_CLINIC_OR_DEPARTMENT_OTHER): Payer: Medicare Other

## 2022-02-10 ENCOUNTER — Inpatient Hospital Stay: Admission: RE | Admit: 2022-02-10 | Payer: Medicare Other | Source: Ambulatory Visit

## 2022-02-13 ENCOUNTER — Ambulatory Visit
Admission: RE | Admit: 2022-02-13 | Discharge: 2022-02-13 | Disposition: A | Discharge status: Home / Self Care | Payer: Medicare Other | Source: Ambulatory Visit | Attending: Student | Admitting: Student

## 2022-02-13 ENCOUNTER — Other Ambulatory Visit (HOSPITAL_BASED_OUTPATIENT_CLINIC_OR_DEPARTMENT_OTHER): Payer: Self-pay | Admitting: Cardiology

## 2022-02-13 DIAGNOSIS — I119 Hypertensive heart disease without heart failure: Secondary | ICD-10-CM

## 2022-02-13 DIAGNOSIS — E785 Hyperlipidemia, unspecified: Secondary | ICD-10-CM

## 2022-02-13 DIAGNOSIS — Z1231 Encounter for screening mammogram for malignant neoplasm of breast: Secondary | ICD-10-CM

## 2022-02-13 DIAGNOSIS — I77819 Aortic ectasia, unspecified site: Secondary | ICD-10-CM

## 2022-02-14 ENCOUNTER — Ambulatory Visit (HOSPITAL_BASED_OUTPATIENT_CLINIC_OR_DEPARTMENT_OTHER): Payer: Medicare Other

## 2022-02-14 NOTE — Telephone Encounter (Signed)
Left the pts daughter a message to call the office back to discuss pre-meds for the pt, so that we can get her CT ANGIO CHEST rescheduled.

## 2022-02-18 NOTE — Telephone Encounter (Signed)
Left the pts daughter another message to call the office back. Will send scheduling a staff message to call the pts daughter back as well, to get the pts CT ANGIO rescheduled, then let myself and Dr. Shari Prows know thereafter, so we can send in pre-meds for claustrophobia thereafter.

## 2022-02-20 NOTE — Telephone Encounter (Signed)
Another staff message sent to [facility] Scheduling to call the pts daughter and reschedule her CT ANGIO CHEST AORTA.  Message advised them to let us know when this is scheduled, so we can treat with pre-meds for claustrophobia if needed.  Pt will not be able to have someone present for scan, due to radiation.

## 2022-02-21 ENCOUNTER — Telehealth: Payer: Self-pay | Admitting: Cardiology

## 2022-02-21 DIAGNOSIS — I77819 Aortic ectasia, unspecified site: Secondary | ICD-10-CM

## 2022-02-21 DIAGNOSIS — I119 Hypertensive heart disease without heart failure: Secondary | ICD-10-CM

## 2022-02-21 NOTE — Telephone Encounter (Signed)
Called patient's daughter to get Ms Kershaw scheduled for her CT chest Dr Shari Prows ordered. Ms Kadel has cancelled the test multiple times due to claustrophobia. Daughter wants to get her rescheduled for the test but is trying to figure out how to make her more comfortable. Patient does not want to be medicated and would like a support person in same room as her. I spoke to her daughter and explained that usually we offer medication to the patient to help them relax and that usually it is only the patient is the exam room due to the radiation. Is there another test we can offer the patient?  Please give daughter a call to discuss [phone].

## 2022-02-25 NOTE — Telephone Encounter (Signed)
Left the pts daughter a message to call the office back to discuss alternative testing with an echo vs pt having CT ANGIO CHEST done.

## 2022-02-25 NOTE — Telephone Encounter (Signed)
Pt will have echo on 03/07/22 at 3:50 pm.  Pt made aware of appt date and time by [facility] Scheduling Dept.  

## 2022-02-25 NOTE — Addendum Note (Signed)
Addended by: Loa Socks on: 02/25/2022 03:31 PM   Modules accepted: Orders

## 2022-02-25 NOTE — Telephone Encounter (Signed)
We can just change to TTE for monitoring. Thank you all for trying!

## 2022-02-25 NOTE — Telephone Encounter (Signed)
Spoke with the pts daughter.  Informed her that per Dr. Pemberton, we will hold off on scheduling the pt her CT ANGIO CHEST AORTA, and order for her to get an echo instead, due to pt having claustrophobia and needing her daughter at the bedside for testing.   Daughter is aware the echo will be done here in the office, and she may be present during the pts echo appt.   Informed the pts daughter that I will place the order for the echo in the system, and send our [facility] Scheduler Mollie a message to call the daughter back to arrange this appt.  Daughter verbalized understanding and agrees with this plan.  Daughter was more than gracious for all the assistance provided. 

## 2022-02-25 NOTE — Telephone Encounter (Signed)
Spoke with the pts daughter.  Informed her that per Dr. Shari Prows, we will hold off on scheduling the pt her CT ANGIO CHEST AORTA, and order for her to get an echo instead, due to pt having claustrophobia and needing her daughter at the bedside for testing.   Daughter is aware the echo will be done here in the office, and she may be present during the pts echo appt.   Informed the pts daughter that I will place the order for the echo in the system, and send our [facility] Scheduler Mollie a message to call the daughter back to arrange this appt.  Daughter verbalized understanding and agrees with this plan.  Daughter was more than gracious for all the assistance provided.

## 2022-02-25 NOTE — Telephone Encounter (Signed)
Daughter returned RN's call. 

## 2022-02-25 NOTE — Telephone Encounter (Signed)
Left the pts daughter a message to call the office back to discuss alternative testing with an echo vs pt having CT ANGIO CHEST done.       Note    Meriam Sprague, MD routed conversation to You 30 minutes ago (10:50 AM)   Meriam Sprague, MD 30 minutes ago (10:50 AM)   HP We can just change to TTE for monitoring. Thank you all for trying!

## 2022-02-25 NOTE — Telephone Encounter (Signed)
Pt will have echo on 03/07/22 at 3:50 pm.  Pt made aware of appt date and time by [facility] Scheduling Dept.

## 2022-03-07 ENCOUNTER — Ambulatory Visit (HOSPITAL_COMMUNITY): Payer: Medicare Other | Attending: Cardiology

## 2022-03-07 DIAGNOSIS — I119 Hypertensive heart disease without heart failure: Secondary | ICD-10-CM | POA: Insufficient documentation

## 2022-03-07 DIAGNOSIS — I340 Nonrheumatic mitral (valve) insufficiency: Secondary | ICD-10-CM

## 2022-03-07 DIAGNOSIS — I77819 Aortic ectasia, unspecified site: Secondary | ICD-10-CM | POA: Insufficient documentation

## 2022-03-07 LAB — ECHOCARDIOGRAM COMPLETE
Area-P 1/2: 1.98 cm2
S' Lateral: 2.5 cm

## 2022-05-18 NOTE — Progress Notes (Deleted)
Cardiology Office Note:    Date:  05/18/2022   ID:  Courtney Moon, DOB 17-May-1956, MRN [id number]  PCP:  Cipriano Mile, NP   [place]  Cardiologist:  None  Advanced Practice Provider:  No care team member to display Electrophysiologist:  None    Referring MD: Cipriano Mile, NP   History of Present Illness:    Courtney Moon is a 66 y.o. female with a hx of HTN, HLD, carotid artery disease and orthostatic hypotension who presents to clinic for follow-up.  The patient had COVID in 08/2020 where she was not behaving like herself and got very agitated and confused and started taking off her clothing. EMS was called and she was taken to [facility] where ECG was normal, trop negative, CXR without acute pathology. She was discharged home.   Was initially seen in clinic on 10/26/20 where she was suffering from significant anxiety, SOB and episodes of chest pain. Notably, was followed by Cardiology in [place] where stress test in 2019 was normal; TTE in 2019 with normal LVEF 56%., mild TR. No known coronary disease. She also had been having worsening dizziness. She underwent carotid ultrasound which showed >70% narrowing in the left ICA. She was planned to see Vascular.  We checked coronary Ca score 02/2021 which was 11.9 (67%). TTE 02/2021 showed LVEF 61%, G1DD, normal RV, mild aortic dilation 39mm (ascending aorta).   Past Medical History:  Diagnosis Date   Anxiety    Arthritis    Bradycardia    Chronic UTI    Gastritis    Hearing loss    HLD (hyperlipidemia)    Hypercholesteremia    Hypertension    Orthostatic hypertension    Thyroid disorder     Past Surgical History:  Procedure Laterality Date   CHOLECYSTECTOMY     ROTATOR CUFF REPAIR      Current Medications: No outpatient medications have been marked as taking for the 05/22/22 encounter (Appointment) with Freada Bergeron, MD.     Allergies:   Oxycodone   Social History   Socioeconomic History    Marital status: Married    Spouse name: Not on file   Number of children: Not on file   Years of education: Not on file   Highest education level: Not on file  Occupational History   Not on file  Tobacco Use   Smoking status: Never   Smokeless tobacco: Never  Vaping Use   Vaping Use: Never used  Substance and Sexual Activity   Alcohol use: Never   Drug use: Never   Sexual activity: Not on file  Other Topics Concern   Not on file  Social History Narrative   Not on file   Social Determinants of Health   Financial Resource Strain: Not on file  Food Insecurity: Not on file  Transportation Needs: Not on file  Physical Activity: Not on file  Stress: Not on file  Social Connections: Not on file     Family History: The patient's family history includes Cancer in her paternal grandmother; Diabetes in her father, mother, and sister; Heart disease in her maternal grandfather and mother; Hypothyroidism in her mother and sister.  ROS:   Please see the history of present illness.    Review of Systems  Constitutional:  Positive for malaise/fatigue. Negative for fever.  HENT:  Negative for hearing loss.   Eyes:  Negative for blurred vision and redness.  Respiratory:  Positive for cough and shortness of breath.  Cardiovascular:  Positive for chest pain. Negative for palpitations, orthopnea, claudication, leg swelling and PND.  Gastrointestinal:  Negative for melena, nausea and vomiting.  Genitourinary:  Negative for dysuria and flank pain.  Musculoskeletal:  Positive for myalgias. Negative for falls.  Neurological:  Positive for dizziness, tingling and sensory change. Negative for loss of consciousness.  Endo/Heme/Allergies:  Negative for polydipsia.  Psychiatric/Behavioral:  The patient is nervous/anxious.     EKGs/Labs/Other Studies Reviewed:    The following studies were reviewed today: Ca score 02/2021: FINDINGS: Coronary Calcium Score:   Left main: 0   Left anterior  descending artery: 11.9   Left circumflex artery: 0   Right coronary artery: 0   Total: 11.9   Percentile: 67th   Pericardium: Normal.   Ascending Aorta: Normal caliber.   Non-cardiac: See separate report from [facility].   IMPRESSION: Coronary calcium score of 11.9. This was 67th percentile for age-, race-, and sex-matched controls.   TTE 02/2021: IMPRESSIONS     1. Left ventricular ejection fraction, by estimation, is 60 to 65%. Left  ventricular ejection fraction by 3D volume is 61 %. The left ventricle has  normal function. The left ventricle has no regional wall motion  abnormalities. There is mild concentric  left ventricular hypertrophy. Left ventricular diastolic parameters are  consistent with Grade I diastolic dysfunction (impaired relaxation).   2. Right ventricular systolic function is normal. The right ventricular  size is normal. There is normal pulmonary artery systolic pressure. The  estimated right ventricular systolic pressure is 17.3 mmHg.   3. The mitral valve is grossly normal. Trivial mitral valve  regurgitation. No evidence of mitral stenosis.   4. The aortic valve is tricuspid. There is mild calcification of the  aortic valve. There is mild thickening of the aortic valve. Aortic valve  regurgitation is not visualized. Mild aortic valve sclerosis is present,  with no evidence of aortic valve  stenosis.   5. Aortic dilatation noted. There is mild dilatation of the ascending  aorta, measuring 39 mm.   6. The inferior vena cava is normal in size with greater than 50%  respiratory variability, suggesting right atrial pressure of 3 mmHg.   Comparison(s): No prior Echocardiogram. Carotid ultrasound 07/12/20: IMPRESSION: Left ICA stenosis estimated at greater than 70%.   Right ICA narrowing, less than 50%.   Normal antegrade vertebral flow bilaterally    EKG:  EKG 08/17/20: NSR with non-specific ST-T wave changes in the lateral  leads  Recent Labs: 01/31/2022: BUN 23; Creatinine, Ser 0.79; Potassium 4.2; Sodium 143  Recent Lipid Panel    Component Value Date/Time   CHOL 113 05/24/2021 0931   TRIG 87 05/24/2021 0931   HDL 46 05/24/2021 0931   CHOLHDL 2.5 05/24/2021 0931   LDLCALC 50 05/24/2021 0931      Physical Exam:    VS:  There were no vitals taken for this visit.    Wt Readings from Last 3 Encounters:  01/03/21 206 lb (93.4 kg)  10/26/20 210 lb (95.3 kg)  08/16/20 214 lb (97.1 kg)     GEN:  Well nourished, well developed in no acute distress HEENT: Normal NECK: No JVD; No carotid bruits CARDIAC: RRR, no murmurs, rubs, gallops RESPIRATORY:  Clear to auscultation without rales, wheezing or rhonchi  ABDOMEN: Soft, non-tender, non-distended MUSCULOSKELETAL:  No edema; No deformity  SKIN: Warm and dry NEUROLOGIC:  Alert and oriented x 3 PSYCHIATRIC:  Normal affect   ASSESSMENT:    No diagnosis found.  PLAN:    In order of problems listed above:  #Chest Pain: #Dyspnea on Exertion: #Prolonged Covid syndrome: Atypical and developed after COVID infection in January. Has had prior work-up in 2019 with Cardiologist in [place] where stress test in 2019 was normal. TTE with normal BiV function, no significant valve disease. Repeat TTE here on 02/2021 with EF 61%, G1DD, no valve disease. Ca score 11.7. She declined stress testing due to significant anxiety with testing. Currently, *** -Reassuring CV work-up  #HTN: Well controlled. -Continue losartan 50mg  daily -Continue coreg 3.125mg  BID  #Orthostatic hypotension: -Continue adequate hydration -Slow position changes -Compression socks  #Left Carotid Stenosis: Recent doppler with >70% left carotid narrowing. -Continue ASA 81mg  daily -Continue atorvastatin 10mg  daily -Continue zetia 10mg  daily  #HLD: -Continue atorvastatin 10mg  daily -Continue zetia 10mg  daily   Medication Adjustments/Labs and Tests Ordered: Current medicines are  reviewed at length with the patient today.  Concerns regarding medicines are outlined above.  No orders of the defined types were placed in this encounter.  No orders of the defined types were placed in this encounter.   There are no Patient Instructions on file for this visit.    Signed, Freada Bergeron, MD  05/18/2022 2:50 PM    [place]

## 2022-05-22 ENCOUNTER — Ambulatory Visit: Payer: Medicare Other | Admitting: Cardiology

## 2022-06-16 NOTE — Progress Notes (Deleted)
Cardiology Office Note:    Date:  06/16/2022   ID:  Courtney Moon, DOB 04-Jul-1956, MRN [id number]  PCP:  Hillery Aldo, NP   [facility]  Cardiologist:  None  Advanced Practice Provider:  No care team member to display Electrophysiologist:  None    Referring MD: Hillery Aldo, NP   History of Present Illness:    Courtney Moon is a 66 y.o. female with a hx of HTN, HLD, carotid artery disease and orthostatic hypotension who presents to clinic for follow-up.  The patient had COVID in 08/2020 where she was not behaving like herself and got very agitated and confused and started taking off her clothing. EMS was called and she was taken to [facility] where ECG was normal, trop negative, CXR without acute pathology. She was discharged home.   Was initially seen in clinic on 10/26/20 where she was suffering from significant anxiety, SOB and episodes of chest pain. Notably, was followed by Cardiology in [place] where stress test in 2019 was normal; TTE in 2019 with normal LVEF 56%., mild TR. No known coronary disease. She also had been having worsening dizziness. She underwent carotid ultrasound which showed >70% narrowing in the left ICA. She was planned to see Vascular.  We checked coronary Ca score 02/2021 which was 11.9 (67%). TTE 02/2021 showed LVEF 61%, G1DD, normal RV, mild aortic dilation 59mm (ascending aorta).   Past Medical History:  Diagnosis Date   Anxiety    Arthritis    Bradycardia    Chronic UTI    Gastritis    Hearing loss    HLD (hyperlipidemia)    Hypercholesteremia    Hypertension    Orthostatic hypertension    Thyroid disorder     Past Surgical History:  Procedure Laterality Date   CHOLECYSTECTOMY     ROTATOR CUFF REPAIR      Current Medications: No outpatient medications have been marked as taking for the 06/19/22 encounter (Appointment) with Meriam Sprague, MD.     Allergies:   Oxycodone   Social History   Socioeconomic History    Marital status: Married    Spouse name: Not on file   Number of children: Not on file   Years of education: Not on file   Highest education level: Not on file  Occupational History   Not on file  Tobacco Use   Smoking status: Never   Smokeless tobacco: Never  Vaping Use   Vaping Use: Never used  Substance and Sexual Activity   Alcohol use: Never   Drug use: Never   Sexual activity: Not on file  Other Topics Concern   Not on file  Social History Narrative   Not on file   Social Determinants of Health   Financial Resource Strain: Not on file  Food Insecurity: Not on file  Transportation Needs: Not on file  Physical Activity: Not on file  Stress: Not on file  Social Connections: Not on file     Family History: The patient's family history includes Cancer in her paternal grandmother; Diabetes in her father, mother, and sister; Heart disease in her maternal grandfather and mother; Hypothyroidism in her mother and sister.  ROS:   Please see the history of present illness.    Review of Systems  Constitutional:  Positive for malaise/fatigue. Negative for fever.  HENT:  Negative for hearing loss.   Eyes:  Negative for blurred vision and redness.  Respiratory:  Positive for cough and shortness of breath.  Cardiovascular:  Positive for chest pain. Negative for palpitations, orthopnea, claudication, leg swelling and PND.  Gastrointestinal:  Negative for melena, nausea and vomiting.  Genitourinary:  Negative for dysuria and flank pain.  Musculoskeletal:  Positive for myalgias. Negative for falls.  Neurological:  Positive for dizziness, tingling and sensory change. Negative for loss of consciousness.  Endo/Heme/Allergies:  Negative for polydipsia.  Psychiatric/Behavioral:  The patient is nervous/anxious.     EKGs/Labs/Other Studies Reviewed:    The following studies were reviewed today: Ca score 02/2021: FINDINGS: Coronary Calcium Score:   Left main: 0   Left anterior  descending artery: 11.9   Left circumflex artery: 0   Right coronary artery: 0   Total: 11.9   Percentile: 67th   Pericardium: Normal.   Ascending Aorta: Normal caliber.   Non-cardiac: See separate report from [facility].   IMPRESSION: Coronary calcium score of 11.9. This was 67th percentile for age-, race-, and sex-matched controls.   TTE 02/2021: IMPRESSIONS     1. Left ventricular ejection fraction, by estimation, is 60 to 65%. Left  ventricular ejection fraction by 3D volume is 61 %. The left ventricle has  normal function. The left ventricle has no regional wall motion  abnormalities. There is mild concentric  left ventricular hypertrophy. Left ventricular diastolic parameters are  consistent with Grade I diastolic dysfunction (impaired relaxation).   2. Right ventricular systolic function is normal. The right ventricular  size is normal. There is normal pulmonary artery systolic pressure. The  estimated right ventricular systolic pressure is 17.3 mmHg.   3. The mitral valve is grossly normal. Trivial mitral valve  regurgitation. No evidence of mitral stenosis.   4. The aortic valve is tricuspid. There is mild calcification of the  aortic valve. There is mild thickening of the aortic valve. Aortic valve  regurgitation is not visualized. Mild aortic valve sclerosis is present,  with no evidence of aortic valve  stenosis.   5. Aortic dilatation noted. There is mild dilatation of the ascending  aorta, measuring 39 mm.   6. The inferior vena cava is normal in size with greater than 50%  respiratory variability, suggesting right atrial pressure of 3 mmHg.   Comparison(s): No prior Echocardiogram. Carotid ultrasound 07/12/20: IMPRESSION: Left ICA stenosis estimated at greater than 70%.   Right ICA narrowing, less than 50%.   Normal antegrade vertebral flow bilaterally    EKG:  EKG 08/17/20: NSR with non-specific ST-T wave changes in the lateral  leads  Recent Labs: 01/31/2022: BUN 23; Creatinine, Ser 0.79; Potassium 4.2; Sodium 143  Recent Lipid Panel    Component Value Date/Time   CHOL 113 05/24/2021 0931   TRIG 87 05/24/2021 0931   HDL 46 05/24/2021 0931   CHOLHDL 2.5 05/24/2021 0931   LDLCALC 50 05/24/2021 0931      Physical Exam:    VS:  There were no vitals taken for this visit.    Wt Readings from Last 3 Encounters:  01/03/21 206 lb (93.4 kg)  10/26/20 210 lb (95.3 kg)  08/16/20 214 lb (97.1 kg)     GEN:  Well nourished, well developed in no acute distress HEENT: Normal NECK: No JVD; No carotid bruits CARDIAC: RRR, no murmurs, rubs, gallops RESPIRATORY:  Clear to auscultation without rales, wheezing or rhonchi  ABDOMEN: Soft, non-tender, non-distended MUSCULOSKELETAL:  No edema; No deformity  SKIN: Warm and dry NEUROLOGIC:  Alert and oriented x 3 PSYCHIATRIC:  Normal affect   ASSESSMENT:    No diagnosis found.  PLAN:    In order of problems listed above:  #Chest Pain: #Dyspnea on Exertion: #Prolonged Covid syndrome: Atypical and developed after COVID infection in January. Has had prior work-up in 2019 with Cardiologist in [place] where stress test in 2019 was normal. TTE with normal BiV function, no significant valve disease. Repeat TTE here on 02/2021 with EF 61%, G1DD, no valve disease. Ca score 11.7. She declined stress testing due to significant anxiety with testing. Currently, *** -Reassuring CV work-up  #HTN: Well controlled. -Continue losartan 50mg  daily -Continue coreg 3.125mg  BID  #Orthostatic hypotension: -Continue adequate hydration -Slow position changes -Compression socks  #Left Carotid Stenosis: Recent doppler with >70% left carotid narrowing. -Continue ASA 81mg  daily -Continue atorvastatin 10mg  daily -Continue zetia 10mg  daily  #HLD: -Continue atorvastatin 10mg  daily -Continue zetia 10mg  daily   Medication Adjustments/Labs and Tests Ordered: Current medicines are  reviewed at length with the patient today.  Concerns regarding medicines are outlined above.  No orders of the defined types were placed in this encounter.  No orders of the defined types were placed in this encounter.   There are no Patient Instructions on file for this visit.    Signed, , MD  06/16/2022 2:26 PM    [facility]

## 2022-06-19 ENCOUNTER — Ambulatory Visit: Payer: Medicare Other | Admitting: Cardiology

## 2022-06-28 LAB — LAB REPORT - SCANNED: EGFR (Non-African Amer.): 70

## 2022-07-10 LAB — HM COLONOSCOPY

## 2022-08-18 ENCOUNTER — Ambulatory Visit: Payer: Medicare Other | Admitting: Cardiology

## 2022-08-26 ENCOUNTER — Ambulatory Visit: Payer: Medicare Other | Admitting: Nurse Practitioner

## 2022-08-28 ENCOUNTER — Ambulatory Visit: Payer: Medicare Other | Admitting: Nurse Practitioner

## 2022-10-06 ENCOUNTER — Ambulatory Visit: Payer: Medicare Other | Admitting: Nurse Practitioner

## 2022-11-06 ENCOUNTER — Encounter: Payer: Self-pay | Admitting: Family Medicine

## 2022-11-06 ENCOUNTER — Ambulatory Visit (INDEPENDENT_AMBULATORY_CARE_PROVIDER_SITE_OTHER): Payer: Medicare Other | Admitting: Family Medicine

## 2022-11-06 VITALS — BP 120/78 | HR 65 | Temp 97.6°F | Ht 61.0 in | Wt 197.0 lb

## 2022-11-06 DIAGNOSIS — E039 Hypothyroidism, unspecified: Secondary | ICD-10-CM | POA: Diagnosis not present

## 2022-11-06 DIAGNOSIS — E785 Hyperlipidemia, unspecified: Secondary | ICD-10-CM

## 2022-11-06 DIAGNOSIS — R7303 Prediabetes: Secondary | ICD-10-CM | POA: Insufficient documentation

## 2022-11-06 DIAGNOSIS — I119 Hypertensive heart disease without heart failure: Secondary | ICD-10-CM | POA: Diagnosis not present

## 2022-11-06 DIAGNOSIS — Z13820 Encounter for screening for osteoporosis: Secondary | ICD-10-CM

## 2022-11-06 DIAGNOSIS — Z1159 Encounter for screening for other viral diseases: Secondary | ICD-10-CM

## 2022-11-06 NOTE — Assessment & Plan Note (Signed)
Will return for fasting labs. Encourged heart healthy diet and 150 minutes moderate intensity exercise weekly.

## 2022-11-06 NOTE — Progress Notes (Signed)
New Patient Office Visit  Subjective    Patient ID: Courtney Moon, female    DOB: 1956/04/08  Age: 67 y.o. MRN: [id number]  CC:  Chief Complaint  Patient presents with   Establish Care    HPI Jeanifer Drayton presents to establish care. Oriented to practice routines and expectations. PMH includes HTN, orthostatic hypotension, HLD, hypothyroidism on Synthroid 6mcg daily, anxiety with panic attacks, carotid stenosis s/p endarderectomy, and prediabetes. She does see a cardiologist. She is enrolled in a diabetes management program through a provider in [place].  FBG at home 90-100s BP at home 120/70s  Breast CA: October 2023 Colon CA: December 2023, polyps benign DEXA: 6-7y ago, osteopenia Never smoker Vaccines declined  HYPERTENSION  Aspirin: yes Recurrent headaches: no Visual changes: no Palpitations: no Dyspnea: no Chest pain: no Lower extremity edema: no Dizzy/lightheaded: no  DIABETES Hypoglycemic episodes:no Polydipsia/polyuria: no Visual disturbance: no Chest pain: no Paresthesias: no   Outpatient Encounter Medications as of 11/06/2022  Medication Sig   aspirin EC 81 MG tablet Take 81 mg by mouth daily. Swallow whole.   ASTAXANTHIN PO Take by mouth. Pt taking 12mg    atorvastatin (LIPITOR) 10 MG tablet Take 10 mg by mouth daily.   Berberine Chloride 500 MG CAPS Take by mouth.   carvedilol (COREG) 3.125 MG tablet Take 3.125 mg by mouth 2 (two) times daily.   ezetimibe (ZETIA) 10 MG tablet Take 10 mg by mouth daily.   levothyroxine (SYNTHROID) 50 MCG tablet Take 50 mcg by mouth daily before breakfast.   losartan (COZAAR) 50 MG tablet Take 75 mg by mouth daily.   pantoprazole (PROTONIX) 40 MG tablet Take 40 mg by mouth daily.   [DISCONTINUED] meclizine (ANTIVERT) 12.5 MG tablet Take 12.5 mg by mouth 2 (two) times daily.   [DISCONTINUED] cyclobenzaprine (FLEXERIL) 5 MG tablet Take 5 mg by mouth daily as needed for muscle spasms. (Patient not taking: Reported on 11/06/2022)    [DISCONTINUED] hydrOXYzine (ATARAX/VISTARIL) 25 MG tablet Take 12.5-25 mg by mouth 3 (three) times daily as needed. (Patient not taking: Reported on 11/06/2022)   No facility-administered encounter medications on file as of 11/06/2022.    Past Medical History:  Diagnosis Date   Anxiety    Arthritis    Bradycardia    Chronic UTI    Gastritis    Hearing loss    HLD (hyperlipidemia)    Hypercholesteremia    Hypertension    Orthostatic hypertension    Thyroid disorder     Past Surgical History:  Procedure Laterality Date   CHOLECYSTECTOMY     ROTATOR CUFF REPAIR      Family History  Problem Relation Age of Onset   Diabetes Mother    Heart disease Mother    Hypothyroidism Mother    Diabetes Father    Diabetes Sister    Hypothyroidism Sister    Heart disease Maternal Grandfather    Cancer Paternal Grandmother     Social History   Socioeconomic History   Marital status: Married    Spouse name: Not on file   Number of children: Not on file   Years of education: Not on file   Highest education level: Not on file  Occupational History   Not on file  Tobacco Use   Smoking status: Never   Smokeless tobacco: Never  Vaping Use   Vaping Use: Never used  Substance and Sexual Activity   Alcohol use: Never   Drug use: Never   Sexual activity: Not on  file  Other Topics Concern   Not on file  Social History Narrative   Not on file   Social Determinants of Health   Financial Resource Strain: Not on file  Food Insecurity: Not on file  Transportation Needs: Not on file  Physical Activity: Not on file  Stress: Not on file  Social Connections: Not on file  Intimate Partner Violence: Not on file    Review of Systems  All other systems reviewed and are negative.       Objective    BP 120/78   Pulse 65   Temp 97.6 F (36.4 C) (Oral)   Ht 5\' 1"  (1.549 m)   Wt 197 lb (89.4 kg)   SpO2 97%   BMI 37.22 kg/m   Physical Exam Vitals and nursing note reviewed.   Constitutional:      Appearance: Normal appearance. She is normal weight.  HENT:     Head: Normocephalic and atraumatic.  Cardiovascular:     Rate and Rhythm: Normal rate and regular rhythm.     Pulses: Normal pulses.     Heart sounds: Normal heart sounds.  Pulmonary:     Effort: Pulmonary effort is normal.     Breath sounds: Normal breath sounds.  Skin:    General: Skin is warm and dry.  Neurological:     General: No focal deficit present.     Mental Status: She is alert and oriented to person, place, and time. Mental status is at baseline.  Psychiatric:        Mood and Affect: Mood normal.        Behavior: Behavior normal.        Thought Content: Thought content normal.        Judgment: Judgment normal.         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 Encourged heart healthy diet and 150 minutes moderate intensity exercise weekly. Seek medical care for chest pain, shortness of breath, dizziness/lightheadedness, recurrent headaches, or vision changes. Will return for labs.      Relevant Medications   aspirin EC 81 MG tablet   Other Relevant Orders   CBC with Differential/Platelet   COMPLETE METABOLIC PANEL WITH GFR   Ambulatory referral to Nephrology     Endocrine   Hypothyroidism    Will check TSH. Continue Synthroid 39mcg daily.      Relevant Medications   levothyroxine (SYNTHROID) 50 MCG tablet   Other Relevant Orders   Thyroid Panel With TSH     Other   Hyperlipidemia    Will return for fasting labs. Encourged heart healthy diet and 150 minutes moderate intensity exercise weekly.      Relevant Medications   aspirin EC 81 MG tablet   Other Relevant Orders   Lipid panel   Prediabetes    Will check A1c.      Relevant Orders   Hemoglobin A1c   Other Visit  Diagnoses     Need for hepatitis C screening test       Relevant Orders   Hepatitis C antibody   Screening for osteoporosis       Relevant Orders   DG Bone Density       Return in about 1 week (around 11/13/2022) for annual physical.   Rubie Maid,  FNP

## 2022-11-06 NOTE — Patient Instructions (Signed)
It was great to meet you today and I'm excited to have you join the [facility] practice. I hope you had a positive experience today! If you feel so inclined, please feel free to recommend our practice to friends and family. Hogan Hoobler, FNP-C  

## 2022-11-06 NOTE — Assessment & Plan Note (Signed)
Will check TSH. Continue Synthroid 50mcg daily. 

## 2022-11-06 NOTE — Assessment & Plan Note (Signed)
Well controlled on Coreg 3.125mg  daily and Cozaar 75mg  daily. Encourged heart healthy diet and 150 minutes moderate intensity exercise weekly. Seek medical care for chest pain, shortness of breath, dizziness/lightheadedness, recurrent headaches, or vision changes. Will return for labs.

## 2022-11-06 NOTE — Assessment & Plan Note (Signed)
Will check A1c

## 2022-11-06 NOTE — Addendum Note (Signed)
Addended by: Rubie Maid on: 11/06/2022 04:37 PM   Modules accepted: Level of Service

## 2022-11-07 ENCOUNTER — Other Ambulatory Visit: Payer: Medicare Other

## 2022-11-13 ENCOUNTER — Other Ambulatory Visit: Payer: Medicare Other

## 2022-11-13 DIAGNOSIS — R7303 Prediabetes: Secondary | ICD-10-CM | POA: Diagnosis not present

## 2022-11-13 DIAGNOSIS — E039 Hypothyroidism, unspecified: Secondary | ICD-10-CM | POA: Diagnosis not present

## 2022-11-13 DIAGNOSIS — I119 Hypertensive heart disease without heart failure: Secondary | ICD-10-CM | POA: Diagnosis not present

## 2022-11-13 DIAGNOSIS — E785 Hyperlipidemia, unspecified: Secondary | ICD-10-CM | POA: Diagnosis not present

## 2022-11-13 DIAGNOSIS — Z1159 Encounter for screening for other viral diseases: Secondary | ICD-10-CM | POA: Diagnosis not present

## 2022-11-14 ENCOUNTER — Encounter: Payer: Self-pay | Admitting: Physician Assistant

## 2022-11-14 ENCOUNTER — Ambulatory Visit: Payer: Medicare Other | Attending: Cardiology | Admitting: Physician Assistant

## 2022-11-14 VITALS — BP 150/82 | HR 63 | Ht 61.0 in | Wt 197.0 lb

## 2022-11-14 DIAGNOSIS — R200 Anesthesia of skin: Secondary | ICD-10-CM | POA: Diagnosis not present

## 2022-11-14 DIAGNOSIS — I719 Aortic aneurysm of unspecified site, without rupture: Secondary | ICD-10-CM

## 2022-11-14 DIAGNOSIS — I119 Hypertensive heart disease without heart failure: Secondary | ICD-10-CM | POA: Diagnosis not present

## 2022-11-14 DIAGNOSIS — I10 Essential (primary) hypertension: Secondary | ICD-10-CM

## 2022-11-14 DIAGNOSIS — I6522 Occlusion and stenosis of left carotid artery: Secondary | ICD-10-CM | POA: Diagnosis not present

## 2022-11-14 DIAGNOSIS — Z79899 Other long term (current) drug therapy: Secondary | ICD-10-CM

## 2022-11-14 DIAGNOSIS — E785 Hyperlipidemia, unspecified: Secondary | ICD-10-CM

## 2022-11-14 DIAGNOSIS — R0609 Other forms of dyspnea: Secondary | ICD-10-CM

## 2022-11-14 LAB — COMPLETE METABOLIC PANEL WITH GFR
AG Ratio: 1.6 (calc) (ref 1.0–2.5)
ALT: 16 U/L (ref 6–29)
AST: 17 U/L (ref 10–35)
Albumin: 4.2 g/dL (ref 3.6–5.1)
Alkaline phosphatase (APISO): 94 U/L (ref 37–153)
BUN/Creatinine Ratio: 33 (calc) — ABNORMAL HIGH (ref 6–22)
BUN: 26 mg/dL — ABNORMAL HIGH (ref 7–25)
CO2: 26 mmol/L (ref 20–32)
Calcium: 9.6 mg/dL (ref 8.6–10.4)
Chloride: 106 mmol/L (ref 98–110)
Creat: 0.8 mg/dL (ref 0.50–1.05)
Globulin: 2.7 g/dL (calc) (ref 1.9–3.7)
Glucose, Bld: 96 mg/dL (ref 65–99)
Potassium: 4.5 mmol/L (ref 3.5–5.3)
Sodium: 143 mmol/L (ref 135–146)
Total Bilirubin: 0.7 mg/dL (ref 0.2–1.2)
Total Protein: 6.9 g/dL (ref 6.1–8.1)
eGFR: 80 mL/min/{1.73_m2} (ref >=60)

## 2022-11-14 LAB — LIPID PANEL
Cholesterol: 125 mg/dL (ref <200)
HDL: 52 mg/dL (ref >=50)
LDL Cholesterol (Calc): 55 mg/dL (calc)
Non-HDL Cholesterol (Calc): 73 mg/dL (calc) (ref <130)
Total CHOL/HDL Ratio: 2.4 (calc) (ref <5.0)
Triglycerides: 96 mg/dL (ref <150)

## 2022-11-14 LAB — HEMOGLOBIN A1C
Hgb A1c MFr Bld: 6.2 % of total Hgb — ABNORMAL HIGH (ref <5.7)
Mean Plasma Glucose: 131 mg/dL
eAG (mmol/L): 7.3 mmol/L

## 2022-11-14 LAB — CBC WITH DIFFERENTIAL/PLATELET
Absolute Monocytes: 607 cells/uL (ref 200–950)
Basophils Absolute: 59 cells/uL (ref 0–200)
Basophils Relative: 0.8 %
Eosinophils Absolute: 281 cells/uL (ref 15–500)
Eosinophils Relative: 3.8 %
HCT: 38.4 % (ref 35.0–45.0)
Hemoglobin: 12.2 g/dL (ref 11.7–15.5)
Lymphs Abs: 2346 cells/uL (ref 850–3900)
MCH: 27.2 pg (ref 27.0–33.0)
MCHC: 31.8 g/dL — ABNORMAL LOW (ref 32.0–36.0)
MCV: 85.5 fL (ref 80.0–100.0)
MPV: 10.8 fL (ref 7.5–12.5)
Monocytes Relative: 8.2 %
Neutro Abs: 4107 cells/uL (ref 1500–7800)
Neutrophils Relative %: 55.5 %
Platelets: 329 10*3/uL (ref 140–400)
RBC: 4.49 10*6/uL (ref 3.80–5.10)
RDW: 14 % (ref 11.0–15.0)
Total Lymphocyte: 31.7 %
WBC: 7.4 10*3/uL (ref 3.8–10.8)

## 2022-11-14 LAB — THYROID PANEL WITH TSH
Free Thyroxine Index: 2.2 (ref 1.4–3.8)
T3 Uptake: 27 % (ref 22–35)
T4, Total: 8.1 ug/dL (ref 5.1–11.9)
TSH: 5.07 mIU/L — ABNORMAL HIGH (ref 0.40–4.50)

## 2022-11-14 LAB — HM HEPATITIS C SCREENING LAB: HM Hepatitis Screen: NEGATIVE

## 2022-11-14 LAB — HEPATITIS C ANTIBODY: Hepatitis C Ab: NONREACTIVE

## 2022-11-14 MED ORDER — LOSARTAN POTASSIUM 25 MG PO TABS: 25.0000 mg | ORAL_TABLET | Freq: Every day | ORAL | 3 refills | Status: DC

## 2022-11-14 MED ORDER — LOSARTAN POTASSIUM 50 MG PO TABS: 50.0000 mg | ORAL_TABLET | Freq: Every day | ORAL | 3 refills | Status: DC

## 2022-11-14 NOTE — Patient Instructions (Signed)
Medication Instructions:  We have sent in 2 separate prescriptions for losartan as discussed: Losartan 25 mg daily along with losartan 50 mg daily for a total of 75 mg daily *If you need a refill on your cardiac medications before your next appointment, please call your pharmacy*   Lab Work: None ordered If you have labs (blood work) drawn today and your tests are completely normal, you will receive your results only by: MyChart Message (if you have MyChart) OR A paper copy in the mail If you have any lab test that is abnormal or we need to change your treatment, we will call you to review the results.   Testing/Procedures: Your physician has requested that you have an echocardiogram in August. Echocardiography is a painless test that uses sound waves to create images of your heart. It provides your doctor with information about the size and shape of your heart and how well your heart's chambers and valves are working. This procedure takes approximately one hour. There are no restrictions for this procedure. Please do NOT wear cologne, perfume, aftershave, or lotions (deodorant is allowed). Please arrive 15 minutes prior to your appointment time.    Follow-Up: At [facility], you and your health needs are our priority.  As part of our continuing mission to provide you with exceptional heart care, we have created designated Provider Care Teams.  These Care Teams include your primary Cardiologist (physician) and Advanced Practice Providers (APPs -  Physician Assistants and Nurse Practitioners) who all work together to provide you with the care you need, when you need it.  Your next appointment:   1 year(s)  Provider:   Meriam Sprague, MD  or Jari Favre, PA-C        Other Instructions Check blood pressure daily, 1-2 hrs after taking morning medications for 2 weeks, keep a log and send [place] the readings via mychart at the end of the 2 weeks  Plan de alimentacin con bajo  contenido de sodio Low-Sodium Eating Plan [place], que es uno de los elementos que constituyen la sal, ayuda a Pharmacologist un equilibrio saludable de los fluidos corporales. Mucha cantidad de sodio puede aumentar la presin arterial y Radio producer que el organismo retenga lquidos y residuos. El mdico o el nutricionista podran recomendarle que siga este plan si tiene presin arterial alta (hipertensin), enfermedad renal, enfermedad heptica o insuficiencia cardaca. El consumo de una menor cantidad de sodio puede ayudar a [employer] presin arterial, reducir la hinchazn y Physicist, medical, el hgado y los riones. Consejos para seguir Consulting civil engineer las etiquetas de los alimentos La etiqueta de informacin nutricional indica la cantidad de sodio en una porcin de alimento. Si come ms de una porcin, debe multiplicar la cantidad indicada de sodio por la cantidad de porciones. Elija alimentos con menos de 140 mg de sodio por porcin. Evite consumir alimentos que tengan 300 mg de sodio o ms por porcin. Al ir de compras  Busque productos con bajo contenido de sodio, en cuyas etiquetas suele indicarse "bajo contenido de sodio" o "sin agregado de sal". Siempre controle el contenido de sodio, incluso si en la etiqueta de los alimentos dice "sin sal" o "sin agregado de sal". Compre alimentos frescos. Evite los alimentos enlatados y comidas precocidas o congeladas. Evite las carnes enlatadas, curadas o procesadas. Compre panes que tengan menos de 80 mg de sodio por rebanada. Al cocinar  Consuma ms comida casera y menos de restaurante, de bares y comida rpida.  Evite agregar sal cuando cocine. Use hierbas o aderezos sin sal, en lugar de sal de mesa o sal marina. Consulte al mdico o farmacutico antes de usar sustitutos de la sal. Cocine con aceites de origen vegetal, como el de canola, girasol u [place]. Planificacin de las comidas Cuando coma en un restaurante, pida que preparen su comida con menos  sal o, en lo posible, sin nada de sal. Evite los platos etiquetados como en salmuera, encurtidos, curados, ahumados o hechos con salsa de soja, miso o salsa teriyaki. Evite consumir alimentos que contengan GMS (glutamato monosdico). El GMS suele agregarse a la comida [place], al consom y a algunos alimentos enlatados. Prepare comidas que puedan ser [place], [place], hervidas, asadas o al vapor. Generalmente, se elaboran con menos sodio. Informacin general La [employer]ley[employer]son de las personas que sigan este plan deben limitar la ingesta de sodio a entre 1500 y 2000 mg (miligramos) por Futures trader. Qu alimentos debo comer? Nils Pyle Frutas frescas, congeladas o enlatadas. Jugo de frutas. Verduras Verduras frescas o congeladas. Verduras enlatadas "sin agregado de sal". Salsa de tomate y pastas "sin agregado de sal". Jugos de tomate y verduras con bajo contenido de sodio o reducidos en sodio. Granos Cereales con bajo contenido de [place], [place], arroz y trigo inflados, y trigo triturado. Galletas con bajo contenido de [place]. Arroz sin sal. Pastas sin sal. Pan con bajo contenido de sodio. Panes y pastas integrales. Carnes y [address] protenas Carne de vaca o ave, pescado y mariscos frescos o congelados (sin agregado de sal). Atn y salmn enlatado con bajo contenido de [place]. Frutos secos sin sal. Lentejas, frijoles y guisantes secos, sin agregado de sal. Frijoles enlatados sin sal. Huevos. Mantequillas de frutos secos sin sal. Lcteos Leche. Leche de soja. Quesos que, por naturaleza, tengan bajo contenido de sodio, por ejemplo, la ricota, la mozzarella fresca o el queso suizo. Quesos con contenido bajo o reducido de sodio. Queso crema. Yogur. Alios y condimentos Hierbas y especias frescas y secas. Aderezos sin sal. Mostaza y ktchup con bajo contenido de [place]. Aderezos para ensalada sin sodio. Mayonesa light sin sodio. Rbano picante fresco o refrigerado. Jugo de limn. Vinagre. Otros alimentos [place]opas caseras o  con contenido de sodio bajo o reducido. Palomitas de maz y pretzels sin sal. Papas fritas sin sal o con bajo contenido de sal. Es posible que los productos que se enumeran ms [place] no constituyan una lista completa de los alimentos y las bebidas que puede tomar. Consulte a un nutricionista para obtener ms informacin. Qu alimentos debo evitar? Verduras [place], verduras en escabeche y salsas. Aceitunas. Papas fritas. Anillos de cebollas. Verduras enlatadas regulares (que no sean con bajo contenido de sodio o reducidas en sodio). Pasta y salsa de tomates enlatadas regulares (que no sean con bajo contenido de sodio o reducidas en sodio). Jugos de tomate y verduras regulares (que no sean con bajo contenido de sodio o reducidos en sodio). Verduras Hydrologist. Granos Cereales instantneos para comer calientes. Mezclas para bizcochos, panqueques y rellenos de pan. Crutones. Mezclas para pastas o arroz con condimento. Envases comerciales de sopa de fideos. Macarrones con queso envasados o congelados. Galletas saladas comunes. Harina leudante. Carnes y [address] de vaca o pescado que est salada, [place], [place], condimentada o en escabeche. Carne precocinada o curada, como embutidos o panes de carne. [place]. Jamn. Pepperoni. Perros calientes. Carne en conserva. Carne picada de buey. Cerdo salado. Cecina o charqui. Arenque en escabeche. Anchoas y sardinas. Atn enlatado comn. Frutos secos con  sal. Lcteos Quesos para untar y quesos procesados. Quesos duros. Requesn. Queso azul. Queso feta. Queso en hebras. Queso cottage comn. Suero de [place]. Leche enlatada. Grasas y aceites Mantequilla con sal. Margarina comn. Mantequilla clarificada. Grasa de panceta. Alios y condimentos Sal de cebolla, sal de ajo, sal condimentada, sal de mesa y sal marina. Salsas en lata y envasadas. Salsa [place]. Salsa trtara. Salsa barbacoa. Salsa teriyaki. Salsa de soja, incluso la que tiene  contenido reducido de [place]. Salsa de carne. Salsa de pescado. Salsa de [place]. Salsa rosada. Rbano picante envasado. [place] y [employer]. Saborizantes y tiernizantes para carne. Caldo en cubitos. Salsa picante. Adobos preelaborados o envasados. Aderezos para tacos preelaborados o envasados. Salsas. Aderezos comunes para ensalada. Salsa. Otros alimentos Palomitas de maz y pretzels con sal. Maz inflado y frituras de maz. Nachos y papas fritas envasadas. Sopas enlatadas o en polvo. Pizza. Pasteles y entradas congeladas. Es posible que los productos que se enumeran ms [place] no constituyan una lista completa de los alimentos y las bebidas que Personnel officer. Consulte a un nutricionista para obtener ms informacin. Resumen El consumo de una menor cantidad de sodio puede ayudar a [employer] presin arterial, reducir la hinchazn y Physicist, medical, el hgado y los riones. La Harley-Davidson de las personas que sigan este plan deben limitar la ingesta de sodio a entre 1500 y 2000 mg (miligramos) por Futures trader. Los [employer], envasados y [address] tienen alto contenido de [place]. La pizza, la comida rpida y la comida de los restaurantes tambin contienen mucho sodio. Tambin aporta sodio al agregar sal a las comidas. Intente cocinar en su hogar, coma ms frutas y verduras frescas, y coma menos comidas rpidas y alimentos enlatados, procesados o preparados. Esta informacin no tiene Theme park manager el consejo del mdico. Asegrese de hacerle al mdico cualquier pregunta que tenga. Document Revised: 08/26/2019 Document Reviewed: 08/26/2019 Elsevier Patient Education  2023 Elsevier Inc.   Plan de alimentacin cardiosaludable Heart-Healthy Eating Plan Muchos factores influyen en la salud cardaca, entre ellos, los hbitos de alimentacin y de ejercicio fsico. La salud del corazn tambin se denomina salud coronaria. El riesgo coronario aumenta cuando hay niveles anormales de grasa (lpidos) en  la [place]. Un plan de alimentacin cardiosaludable implica limitar las grasas poco saludables, aumentar las grasas saludables, limitar el consumo de sal (sodio) y hacer otros cambios en la dieta y el estilo de [place]. En qu consiste el plan? El mdico podra recomendarle que haga lo siguiente: Que limite la ingesta de grasas al ________ % o menos del total de caloras por da. Que limite la ingesta de grasas saturadas al _________ % o menos del total de caloras por da. Que limite la cantidad de colesterol en su dieta a menos de _________ mg por da. Que limite la cantidad de sodio en su dieta a menos de _________ mg por da. Cules son algunos consejos para seguir este plan? Al cocinar Evite frer los alimentos a la hora de la coccin. Hornear, hervir, grillar y asar a la parrilla son buenas opciones. Otras formas de reducir el consumo de grasas son las siguientes: Quite la piel de las aves. Quite todas las grasas visibles de las carnes. Cocine al vapor las verduras en agua o caldo. Planificacin de las comidas  En las comidas, imagine dividir su plato en cuartos: Llene la mitad del plato con verduras y ensaladas de hojas verdes. Llene un cuarto del plato con cereales integrales. Llene un cuarto del plato con alimentos con protenas  magras. Coma de 2 a 4 tazas de Copy. Una taza de verduras equivale a 1 taza (91 g) de brcoli o coliflor, 2 zanahorias medianas, 1 pimiento morrn grande, 1 batata grande, 1 tomate grande, 1 patata blanca mediana, 2 tazas (150 g) de verduras de [employer] crudas. Coma de 1 a 2 tazas de Radiation protection practitioner. Una taza de fruta equivale a 1 manzana pequea, 1 banana grande, 1 taza (237 g) de fruta mixta, 1 naranja grande,  taza (82 g) de fruta seca, 1 taza (240 ml) de jugo de fruta al 100 %. Consuma ms alimentos con fibra soluble. Entre ellos, se incluyen las [place], el [place], las [place], los frijoles, los guisantes y Aeronautical engineer. Trate de consumir  de 25 a 30 g de [employer]. Aumente el consumo de legumbres, frutos secos y semillas a 4 o 5 porciones por semana. Una porcin de frijoles secos o legumbres equivale a 1?4 taza (90 g) cocinados, 1 porcin de frutos secos equivale a  oz (12 almendras, 24 pistachos o 7 mitades de nuez) y 1 porcin de semillas equivale a  oz (8 g). Grasas Elija grasas saludables con mayor frecuencia. Elija las grasas monoinsaturadas y [address], como el aceite de oliva y canola, el aceite de [place], la semillas de [place], las nueces, las almendras y las semillas. Consuma ms grasas omega-3. Elija salmn, caballa, sardinas, atn, aceite de lino y semillas de lino molidas. Propngase comer pescado al [employer] veces por semana. Lea las etiquetas de los alimentos detenidamente para identificar los que contienen grasas trans o altas cantidades de grasas saturadas. Limite el consumo de grasas saturadas. Estas se encuentran en productos de origen animal, como carnes, mantequilla y crema. Las grasas saturadas de origen vegetal incluyen aceite de palma, de palmiste y de coco. Evite los alimentos con aceites parcialmente hidrogenados. Estos contienen grasas trans. [address] [place], se incluyen margarinas en barra, algunas margarinas untables, galletas dulces y [place] y otros productos horneados. Evite las comidas fritas. Informacin general Consuma ms comida casera y menos de restaurante, de bares y comida rpida. Limite o evite el alcohol. Limite los alimentos con alto contenido de International aid/development worker aadido y almidones simples, como los alimentos elaborados con harina blanca refinada (pan blanco, pasteles, dulces). Baje de peso si es necesario. Perder solo del 5 al 10 % de su peso corporal puede ayudarlo a mejorar su estado de salud general y a Education officer, museum, como la diabetes y las enfermedades cardacas. Controle la ingesta de sodio, especialmente si tiene presin arterial alta. Hable con el mdico acerca de cambiar  la ingesta de sodio. Intente incorporar ms comidas vegetarianas cada semana. Qu alimentos debo comer? Nils Pyle Nils Pyle frescas, en conserva (en su jugo natural) o frutas congeladas. Verduras Verduras frescas o congeladas (crudas, al vapor, asadas o grilladas). Ensaladas de hojas verdes. Cereales La mayora de los cereales. Elija casi siempre trigo integral y Radiation protection practitioner. Arroz y pastas, incluido el arroz integral y las pastas elaboradas con trigo integral. Armed forces operational officer y otras protenas Carnes magras de res, ternera, cerdo y cordero a las que se les haya quitado la grasa visible. Pollo y pavo sin piel. Todos los pescados y [employer]. Pato salvaje, conejo, faisn y venado. Claras de huevo o sustitutos del huevo bajos en colesterol. Porotos, guisantes y lentejas secos y tofu. Semillas y la mayora de los frutos secos. Lcteos Quesos descremados y semidescremados, entre ellos, ricota y Garment/textile technologist. Leche descremada o al 1 % (lquida, en polvo o evaporada).  Suero de [employer] elaborado con [employer]. Yogur descremado o semidescremado. Grasas y Writer no hidrogenadas (sin grasas trans). Aceites vegetales, incluido el de soja, ssamo, girasol, [place], [place], man, crtamo, maz, canola y semillas de algodn. Alios para ensalada o mayonesa elaborados con aceite vegetal. Halina Andreas (mineral o con gas). T y caf. T helado sin endulzar. Bebidas dietticas. Dulces y [employer], gelatina y helado de frutas. Pequeas cantidades de chocolate amargo. Limite todos los dulces y postres. Alios y [employer] y condimentos. Es posible que los productos que se enumeran ms [place] no constituyan una lista completa de los alimentos y las bebidas que puede tomar. Consulte a un nutricionista para conocer ms opciones. Qu alimentos debo evitar? Nils Pyle Fruta enlatada en almbar espeso. Frutas con salsa de crema o mantequilla. Frutas fritas. Limite el consumo de  coco. Verduras Verduras cocinadas con salsas de queso, crema o mantequilla. Verduras fritas. Cereales Panes elaborados con grasas saturadas o trans, aceites o [employer]. Croissants. Panecillos dulces. Rosquillas. Galletas con alto contenido de grasas, como las que galletas de queso y las papas fritas. Carnes y [address] protenas [address] grasas, como perros calientes, [place] de res, salchichas, tocino, asado de Producer, television/film/video o [place]. Fiambres con alto contenido de [place], como salame y Loss adjuster, chartered. Caviar. Pato y ganso domsticos. Vsceras, como hgado. Lcteos Crema, crema agria, queso crema y Anita cottage con crema. Quesos enteros. Leche entera o al 2 % (lquida, evaporada o condensada). Suero de [employer]. Salsa de crema o queso con alto contenido de Wellman. Yogur de [employer]ak. Grasas y [place] de carne o materia grasa. Manteca de cacao, aceites hidrogenados, aceite de palma, aceite de coco, aceite de palmiste. Grasas y [address] grasas slidas, incluida la grasa del tocino, el cerdo [place], la [place] de cerdo y Civil engineer, contracting. Sustitutos no lcteos de la crema. Aderezos para ensalada con queso o crema agria. Bebidas Refrescos regulares y cualquier bebida con agregado de azcar. Dulces y [employer]. Pudin. Galletas dulces. Bizcochuelos. Pasteles. Chocolate con leche o chocolate blanco. Almbares con mantequilla. Helados o bebidas elaboradas con helado con alto contenido de grasas. Es posible que los productos que se enumeran ms arriba no sean una lista completa de los alimentos y las bebidas que se Theatre stage manager. Consulte a un nutricionista para obtener ms informacin. Resumen La planificacin de comidas cardiosaludables implica limitar las grasas poco saludables, aumentar las grasas saludables, limitar el consumo de sal (sodio) y Radio producer otros cambios en la dieta y el estilo de [place]. Baje de peso si es necesario. Perder solo del 5 al 10 % de su peso corporal puede ayudarlo a  mejorar su estado de salud general y a Education officer, museum, como la diabetes y las enfermedades cardacas. Propngase seguir una dieta equilibrada, que incluya frutas y verduras, productos lcteos descremados o semidescremados, protenas magras, frutos secos y legumbres, cereales integrales y aceites y grasas cardiosaludables. Esta informacin no tiene Theme park manager el consejo del mdico. Asegrese de hacerle al mdico cualquier pregunta que tenga. Document Revised: 09/11/2021 Document Reviewed: 09/11/2021 Elsevier Patient Education  2023 [employer].

## 2022-11-14 NOTE — Progress Notes (Signed)
Office Visit    Patient Name: Courtney Moon Date of Encounter: 11/14/2022  PCP:  Park Meo, FNP   [facility]  Cardiologist:  Meriam Sprague, MD  Advanced Practice Provider:  No care team member to display Electrophysiologist:  None   HPI    Courtney Moon is a 67 y.o. female with a past medical history of hypertension, hyperlipidemia, carotid artery disease, and orthostatic hypotension presents today for follow-up visit.  Patient had COVID 08/2020.  She got very agitated and confused.  EMS was called and she was taken to [facility] where EKG was normal, troponin negative, CXR without acute pathology.  Discharged home.  She was last seen by Dr. Shari Prows March 2022 and had suffered with significant anxiety, shortness of breath, and episodes of chest pain.  Her chest discomfort and breathing symptoms were usually worse with anxiety but could occur with exertion as well.  Notably, she was followed by cardiology in [place] where stress test was performed in 2019 which was normal.  TTE in 2019 with normal LVEF 56%, mild TR.  No known coronary disease.  She also describes worsening dizziness.  She underwent carotid ultrasound which showed greater than 70% narrowing of the left ICA.  Plan to see vascular surgery.  Also has left leg numbness and was planning to see neurology for further management.  Today, she tells me that she is having tachycardia at night which she thinks is related to her anxiety. She tried a couple of anxiety medications and they made things worse. She  Tachycardia at night. Anxiety episodes. Herbal supplements. She tried some medications from her PCP. She uses essential oils and Tranquilene for her anxiety. Her BP at home is well controlled, 133/80. She tells me she has white coat syndrome. She otherwise does not have any chest pain or SOB. Discussed monitoring BP at home and sending me those values.  Reports no shortness of breath nor dyspnea on  exertion. Reports no chest pain, pressure, or tightness. No edema, orthopnea, PND. Reports no palpitations.    Past Medical History    Past Medical History:  Diagnosis Date   Anxiety    Arthritis    Bradycardia    Chronic UTI    Gastritis    Hearing loss    HLD (hyperlipidemia)    Hypercholesteremia    Hypertension    Orthostatic hypertension    Thyroid disorder    Past Surgical History:  Procedure Laterality Date   CHOLECYSTECTOMY     ROTATOR CUFF REPAIR      Allergies  Allergies  Allergen Reactions   Oxycodone     Anxiety/Aggiationwith narcotics    EKGs/Labs/Other Studies Reviewed:   The following studies were reviewed today:  Echo 03/07/22 IMPRESSIONS     1. Left ventricular ejection fraction, by estimation, is 60 to 65%. Left  ventricular ejection fraction by 3D volume is 62 %. The left ventricle has  normal function. The left ventricle has no regional wall motion  abnormalities. There is mild left  ventricular hypertrophy. Left ventricular diastolic parameters are  consistent with Grade I diastolic dysfunction (impaired relaxation). The  average left ventricular global longitudinal strain is -25.1 %. The global  longitudinal strain is normal.   2. Right ventricular systolic function is normal. The right ventricular  size is normal. There is normal pulmonary artery systolic pressure.   3. Left atrial size was mildly dilated.   4. The mitral valve is normal in structure. Mild  mitral valve  regurgitation. Moderate mitral annular calcification.   5. The aortic valve is tricuspid. There is mild calcification of the  aortic valve. There is mild thickening of the aortic valve. Aortic valve  regurgitation is not visualized. Aortic valve sclerosis/calcification is  present, without any evidence of  aortic stenosis.   6. Aortic dilatation noted. There is mild dilatation of the ascending  aorta, measuring 40 mm.   7. The inferior vena cava is normal in size with  greater than 50%  respiratory variability, suggesting right atrial pressure of 3 mmHg.   8. Cannot exclude a small PFO.   Comparison(s): 02/13/21 EF 60-65%. Ascending aorta 39mm. Overall, no  significant change from prior.   EKG:  EKG is  ordered today.  The ekg ordered today demonstrates NSR rate 63 bpm  Recent Labs: 11/13/2022: ALT 16; BUN 26; Creat 0.80; Hemoglobin 12.2; Platelets 329; Potassium 4.5; Sodium 143; TSH 5.07  Recent Lipid Panel    Component Value Date/Time   CHOL 125 11/13/2022 0815   CHOL 113 05/24/2021 0931   TRIG 96 11/13/2022 0815   HDL 52 11/13/2022 0815   HDL 46 05/24/2021 0931   CHOLHDL 2.4 11/13/2022 0815   LDLCALC 55 11/13/2022 0815     Home Medications   Current Meds  Medication Sig   aspirin EC 81 MG tablet Take 81 mg by mouth daily. Swallow whole.   ASTAXANTHIN PO Take by mouth. Pt taking    atorvastatin (LIPITOR) 10 MG tablet Take 10 mg by mouth daily.   Berberine Chloride 500 MG CAPS Take by mouth.   carvedilol (COREG) 3.125 MG tablet Take 3.125 mg by mouth 2 (two) times daily.   ezetimibe (ZETIA) 10 MG tablet Take 10 mg by mouth daily.   levothyroxine (SYNTHROID) 50 MCG tablet Take 50 mcg by mouth daily before breakfast.   losartan (COZAAR) 25 MG tablet Take 1 tablet (25 mg total) by mouth daily. Take along with the 50 mg tablet to equal 75 mg daily   pantoprazole (PROTONIX) 40 MG tablet Take 40 mg by mouth daily.   [DISCONTINUED] losartan (COZAAR) 50 MG tablet Take 75 mg by mouth daily.     Review of Systems      All other systems reviewed and are otherwise negative except as noted above.  Physical Exam    VS:  BP (!) 150/82   Pulse 63   Ht  (1.549 m)   Wt 197 lb (89.4 kg)   BMI 37.22 kg/m  , BMI Body mass index is 37.22 kg/m.  Wt Readings from Last 3 Encounters:  11/14/22 197 lb (89.4 kg)  11/06/22 197 lb (89.4 kg)  01/03/21 206 lb (93.4 kg)     GEN: Well nourished, well developed, in no acute distress. HEENT:  normal. Neck: Supple, no JVD, carotid bruits, or masses. Cardiac: RRR, no murmurs, rubs, or gallops. No clubbing, cyanosis, edema.  Radials/PT 2+ and equal bilaterally.  Respiratory:  Respirations regular and unlabored, clear to auscultation bilaterally. GI: Soft, nontender, nondistended. MS: No deformity or atrophy. Skin: Warm and dry, no rash. Neuro:  Strength and sensation are intact. Psych: Normal affect.  Assessment & Plan     HTN -high today but has been well controlled at home -continue current dose of losartan  daily -continue coreg 3.125mg  BID -continue low sodium diet -continue to monitor at home daily x 2 weeks and send me those values  Aortic aneurysm -40mm when we last checked Aug 2023 -will plan  for f/u echo Aug 2024  HLD -labs recently, LDL 55  Orthostatic hypotension/L carotid artery stenosis -better with the lightheadedness and now using essential oils -had surgery on her left carotid artery  Left leg numbness -she was told she would need a full body MRI and with her anxiety she decided against this -she has been using essential oils and it helps      Disposition: Follow up 1 year with Meriam Sprague, MD or APP.  Signed, Sharlene Dory, PA-C 11/14/2022, 4:45 PM [facility]

## 2022-11-17 MED ORDER — LEVOTHYROXINE SODIUM 75 MCG PO TABS
75.0000 ug | ORAL_TABLET | Freq: Every day | ORAL | 0 refills | Status: DC
Start: 2022-11-17 — End: 2023-01-01

## 2022-11-17 NOTE — Addendum Note (Signed)
Addended by: Park Meo on: 11/17/2022 09:24 AM   Modules accepted: Orders

## 2022-11-18 ENCOUNTER — Telehealth: Payer: Self-pay

## 2022-11-18 NOTE — Telephone Encounter (Signed)
Rec' msg from Dyke Brackett :  [facility] called re pt.  May be scheduled earlier than (01/12/23) depending on pt labs.   Need to fax labs to [phone] and or call [phone]  Spoke w/Tiffany and fax labs this morning

## 2022-11-21 ENCOUNTER — Other Ambulatory Visit: Payer: Self-pay

## 2022-11-21 ENCOUNTER — Encounter: Payer: Self-pay | Admitting: Family Medicine

## 2022-11-21 MED ORDER — VITAMIN D3 25 MCG (1000 UT) PO CAPS: 1000.0000 [IU] | ORAL_CAPSULE | Freq: Every day | ORAL | 0 refills | Status: AC

## 2022-11-21 MED ORDER — ELDERBERRY 500 MG PO CAPS: 500.0000 mg | ORAL_CAPSULE | Freq: Once | ORAL | Status: AC

## 2022-11-21 MED ORDER — OMEGA-3 FATTY ACIDS 1000 MG PO CAPS: 2.0000 g | ORAL_CAPSULE | Freq: Every day | ORAL | Status: AC

## 2022-11-21 MED ORDER — B COMPLEX-C PO TABS: 1.0000 | ORAL_TABLET | Freq: Every day | ORAL | Status: AC

## 2022-11-21 MED ORDER — TURMERIC 500 MG PO CAPS: 500.0000 mg | ORAL_CAPSULE | Freq: Once | ORAL | Status: AC

## 2022-11-21 MED ORDER — ECHINACEA 80 MG PO CAPS: 80.0000 mg | ORAL_CAPSULE | Freq: Once | ORAL | 0 refills | Status: AC

## 2022-11-27 ENCOUNTER — Encounter: Payer: Medicare Other | Admitting: Family Medicine

## 2022-12-18 ENCOUNTER — Encounter: Payer: Self-pay | Admitting: Family Medicine

## 2022-12-18 ENCOUNTER — Ambulatory Visit (INDEPENDENT_AMBULATORY_CARE_PROVIDER_SITE_OTHER): Payer: Medicare Other | Admitting: Family Medicine

## 2022-12-18 VITALS — BP 138/68 | HR 67 | Temp 98.8°F | Ht 61.0 in | Wt 200.0 lb

## 2022-12-18 DIAGNOSIS — E039 Hypothyroidism, unspecified: Secondary | ICD-10-CM | POA: Diagnosis not present

## 2022-12-18 DIAGNOSIS — I719 Aortic aneurysm of unspecified site, without rupture: Secondary | ICD-10-CM | POA: Diagnosis not present

## 2022-12-18 NOTE — Progress Notes (Signed)
Acute Office Visit  Subjective:     Patient ID: Courtney Moon, female    DOB: 1956-01-24, 67 y.o.   MRN: [id number]  Chief Complaint  Patient presents with   Follow-up     1 week (around 11/13/2022) for annual physical - [facility]      HPI Patient is in today for thyroid follow-up. Her last TSH was 5.07 and her Synthroid was increased to daily.  HYPOTHYROIDISM Thyroid control status:uncontrolled Satisfied with current treatment? yes Medication side effects: no Medication compliance: excellent compliance Etiology of hypothyroidism:  Recent dose adjustment:yes Fatigue: no Cold intolerance: no Heat intolerance: no Weight gain: no Weight loss: no Constipation: no Diarrhea/loose stools: no Palpitations: yes Lower extremity edema: no Anxiety/depressed mood: no   Review of Systems  All other systems reviewed and are negative.   Past Medical History:  Diagnosis Date   Anxiety    Arthritis    Bradycardia    Chronic UTI    Gastritis    Hearing loss    HLD (hyperlipidemia)    Hypercholesteremia    Hypertension    Orthostatic hypertension    Thyroid disorder    Past Surgical History:  Procedure Laterality Date   CHOLECYSTECTOMY     ROTATOR CUFF REPAIR     Current Outpatient Medications on File Prior to Visit  Medication Sig Dispense Refill   aspirin EC 81 MG tablet Take 81 mg by mouth daily. Swallow whole.     ASTAXANTHIN PO Take by mouth. Pt taking 12mg      atorvastatin (LIPITOR) 10 MG tablet Take 10 mg by mouth daily.     B Complex-C (B-COMPLEX WITH VITAMIN C) tablet Take 1 tablet by mouth daily.     Berberine Chloride 500 MG CAPS Take by mouth.     carvedilol (COREG) 3.125 MG tablet Take 3.125 mg by mouth 2 (two) times daily.     Cholecalciferol (VITAMIN D3) 25 MCG (1000 UT) CAPS Take 1 capsule (1,000 Units total) by mouth daily. 60 capsule 0   ezetimibe (ZETIA) 10 MG tablet Take 10 mg by mouth daily.     fish oil-omega-3 fatty acids 1000 MG capsule Take 2  capsules (2 g total) by mouth daily.     levothyroxine (SYNTHROID) 75 MCG tablet Take 1 tablet (75 mcg total) by mouth daily before breakfast. 30 tablet 0   losartan (COZAAR) 25 MG tablet Take 1 tablet (25 mg total) by mouth daily. Take along with the 50 mg tablet to equal 75 mg daily 90 tablet 3   losartan (COZAAR) 50 MG tablet Take 1 tablet (50 mg total) by mouth daily. Take along with the 25 mg tablet to equal 75 mg daily 90 tablet 3   pantoprazole (PROTONIX) 40 MG tablet Take 40 mg by mouth daily.     No current facility-administered medications on file prior to visit.   Allergies  Allergen Reactions   Oxycodone     Anxiety/Aggiationwith narcotics       Objective:    BP 138/68   Pulse 67   Temp 98.8 F (37.1 C) (Oral)   Ht 5\' 1"  (1.549 m)   Wt 200 lb (90.7 kg)   SpO2 98%   BMI 37.79 kg/m  BP Readings from Last 3 Encounters:  12/18/22 138/68  11/14/22 (!) 150/82  11/06/22 120/78      Physical Exam Vitals and nursing note reviewed.  Constitutional:      Appearance: Normal appearance. She is normal weight.  HENT:  Head: Normocephalic and atraumatic.  Cardiovascular:     Rate and Rhythm: Normal rate and regular rhythm.     Pulses: Normal pulses.     Heart sounds: Normal heart sounds.  Pulmonary:     Effort: Pulmonary effort is normal.     Breath sounds: Normal breath sounds.  Skin:    General: Skin is warm and dry.  Neurological:     General: No focal deficit present.     Mental Status: She is alert and oriented to person, place, and time. Mental status is at baseline.  Psychiatric:        Mood and Affect: Mood normal.        Behavior: Behavior normal.        Thought Content: Thought content normal.        Judgment: Judgment normal.     No results found for any visits on 12/18/22.      Assessment & Plan:   Problem List Items Addressed This Visit     Hypothyroidism - Primary    Will check TSH. Continue Synthroid daily and will titrate as  indicated by labs.      Other Visit Diagnoses     Aortic aneurysm without rupture, unspecified portion of aorta (HCC)           No orders of the defined types were placed in this encounter.   Return in about 6 months (around 06/20/2023) for chronic follow-up with labs 1 week prior.  Park Meo, FNP

## 2022-12-18 NOTE — Assessment & Plan Note (Signed)
Will check TSH. Continue Synthroid daily and will titrate as indicated by labs.

## 2022-12-19 LAB — TSH: TSH: 3.57 mIU/L (ref 0.40–4.50)

## 2023-01-01 ENCOUNTER — Ambulatory Visit: Payer: Medicare Other

## 2023-01-01 ENCOUNTER — Telehealth: Payer: Self-pay | Admitting: Family Medicine

## 2023-01-01 ENCOUNTER — Other Ambulatory Visit: Payer: Self-pay

## 2023-01-01 DIAGNOSIS — E039 Hypothyroidism, unspecified: Secondary | ICD-10-CM

## 2023-01-01 MED ORDER — LEVOTHYROXINE SODIUM 75 MCG PO TABS
75.0000 ug | ORAL_TABLET | Freq: Every day | ORAL | 1 refills | Status: DC
Start: 2023-01-01 — End: 2023-03-12

## 2023-01-01 NOTE — Telephone Encounter (Signed)
Prescription Request  01/01/2023  LOV: 12/18/2022  What is the name of the medication or equipment?   levothyroxine (SYNTHROID) 75 MCG tablet [[id number]]  **Patient requesting a 90 day supply pending recent lab results. Requesting call back**  Have you contacted your pharmacy to request a refill? Yes   Which pharmacy would you like this sent to?  [facility]) [place], [place] - [address] [place] [address] [place] [address] Phone: [phone] Fax: [phone]    Patient notified that their request is being sent to the clinical staff for review and that they should receive a response within 2 business days.   Please advise at [phone] with lab results and status of 90 day refill request. Patient stated she only received a month supply.

## 2023-01-07 NOTE — Patient Instructions (Incomplete)
Ms. Depas , Thank you for taking time to come for your Medicare Wellness Visit. I appreciate your ongoing commitment to your health goals. Please review the following plan we discussed and let me know if I can assist you in the future.   These are the goals we discussed:  Goals   None     This is a list of the screening recommended for you and due dates:  Health Maintenance  Topic Date Due   Medicare Annual Wellness Visit  Never done   DEXA scan (bone density measurement)  Never done   Pneumonia Vaccine (1 of 1 - PCV) 07/20/2023*   Zoster (Shingles) Vaccine (1 of 2) 12/04/2023*   Flu Shot  03/05/2023   Mammogram  02/14/2024   Colon Cancer Screening  07/10/2032   Hepatitis C Screening  Completed   HPV Vaccine  Aged Out   DTaP/Tdap/Td vaccine  Discontinued   COVID-19 Vaccine  Discontinued  *Topic was postponed. The date shown is not the original due date.    Advanced directives: Information on Advanced Care Planning can be found at [facility] Advance Health Care Directives Advance Health Care Directives ([email or web address]  Please bring a copy of your health care power of attorney and living will to the office to be added to your chart at your convenience.   Conditions/risks identified: Aim for 30 minutes of exercise or brisk walking, 6-8 glasses of water, and 5 servings of fruits and vegetables each day.  Next appointment: Follow up in one year for your annual wellness visit    Preventive Care 65 Years and Older, Female Preventive care refers to lifestyle choices and visits with your health care provider that can promote health and wellness. What does preventive care include? A yearly physical exam. This is also called an annual well check. Dental exams once or twice a year. Routine eye exams. Ask your health care provider how often you should have your eyes checked. Personal lifestyle choices, including: Daily care of your teeth and gums. Regular physical  activity. Eating a healthy diet. Avoiding tobacco and drug use. Limiting alcohol use. Practicing safe sex. Taking low-dose aspirin every day. Taking vitamin and mineral supplements as recommended by your health care provider. What happens during an annual well check? The services and screenings done by your health care provider during your annual well check will depend on your age, overall health, lifestyle risk factors, and family history of disease. Counseling  Your health care provider may ask you questions about your: Alcohol use. Tobacco use. Drug use. Emotional well-being. Home and relationship well-being. Sexual activity. Eating habits. History of falls. Memory and ability to understand (cognition). Work and work Astronomer. Reproductive health. Screening  You may have the following tests or measurements: Height, weight, and BMI. Blood pressure. Lipid and cholesterol levels. These may be checked every 5 years, or more frequently if you are over 21 years old. Skin check. Lung cancer screening. You may have this screening every year starting at age 31 if you have a 30-pack-year history of smoking and currently smoke or have quit within the past 15 years. Fecal occult blood test (FOBT) of the stool. You may have this test every year starting at age 29. Flexible sigmoidoscopy or colonoscopy. You may have a sigmoidoscopy every 5 years or a colonoscopy every 10 years starting at age 55. Hepatitis C blood test. Hepatitis B blood test. Sexually transmitted disease (STD) testing. Diabetes screening. This is done by checking your blood  sugar (glucose) after you have not eaten for a while (fasting). You may have this done every 1-3 years. Bone density scan. This is done to screen for osteoporosis. You may have this done starting at age 60. Mammogram. This may be done every 1-2 years. Talk to your health care provider about how often you should have regular mammograms. Talk with your  health care provider about your test results, treatment options, and if necessary, the need for more tests. Vaccines  Your health care provider may recommend certain vaccines, such as: Influenza vaccine. This is recommended every year. Tetanus, diphtheria, and acellular pertussis (Tdap, Td) vaccine. You may need a Td booster every 10 years. Zoster vaccine. You may need this after age 69. Pneumococcal 13-valent conjugate (PCV13) vaccine. One dose is recommended after age 79. Pneumococcal polysaccharide (PPSV23) vaccine. One dose is recommended after age 68. Talk to your health care provider about which screenings and vaccines you need and how often you need them. This information is not intended to replace advice given to you by your health care provider. Make sure you discuss any questions you have with your health care provider. Document Released: 08/17/2015 Document Revised: 04/09/2016 Document Reviewed: 05/22/2015 Elsevier Interactive Patient Education  2017 [employer].  Fall Prevention in the Home Falls can cause injuries. They can happen to people of all ages. There are many things you can do to make your home safe and to help prevent falls. What can I do on the outside of my home? Regularly fix the edges of walkways and driveways and fix any cracks. Remove anything that might make you trip as you walk through a door, such as a raised step or threshold. Trim any bushes or trees on the path to your home. Use bright outdoor lighting. Clear any walking paths of anything that might make someone trip, such as rocks or tools. Regularly check to see if handrails are loose or broken. Make sure that both sides of any steps have handrails. Any raised decks and porches should have guardrails on the edges. Have any leaves, snow, or ice cleared regularly. Use sand or salt on walking paths during winter. Clean up any spills in your garage right away. This includes oil or grease spills. What can I  do in the bathroom? Use night lights. Install grab bars by the toilet and in the tub and shower. Do not use towel bars as grab bars. Use non-skid mats or decals in the tub or shower. If you need to sit down in the shower, use a plastic, non-slip stool. Keep the floor dry. Clean up any water that spills on the floor as soon as it happens. Remove soap buildup in the tub or shower regularly. Attach bath mats securely with double-sided non-slip rug tape. Do not have throw rugs and other things on the floor that can make you trip. What can I do in the bedroom? Use night lights. Make sure that you have a light by your bed that is easy to reach. Do not use any sheets or blankets that are too big for your bed. They should not hang down onto the floor. Have a firm chair that has side arms. You can use this for support while you get dressed. Do not have throw rugs and other things on the floor that can make you trip. What can I do in the kitchen? Clean up any spills right away. Avoid walking on wet floors. Keep items that you use a lot in easy-to-reach  places. If you need to reach something above you, use a strong step stool that has a grab bar. Keep electrical cords out of the way. Do not use floor polish or wax that makes floors slippery. If you must use wax, use non-skid floor wax. Do not have throw rugs and other things on the floor that can make you trip. What can I do with my stairs? Do not leave any items on the stairs. Make sure that there are handrails on both sides of the stairs and use them. Fix handrails that are broken or loose. Make sure that handrails are as long as the stairways. Check any carpeting to make sure that it is firmly attached to the stairs. Fix any carpet that is loose or worn. Avoid having throw rugs at the top or bottom of the stairs. If you do have throw rugs, attach them to the floor with carpet tape. Make sure that you have a light switch at the top of the stairs  and the bottom of the stairs. If you do not have them, ask someone to add them for you. What else can I do to help prevent falls? Wear shoes that: Do not have high heels. Have rubber bottoms. Are comfortable and fit you well. Are closed at the toe. Do not wear sandals. If you use a stepladder: Make sure that it is fully opened. Do not climb a closed stepladder. Make sure that both sides of the stepladder are locked into place. Ask someone to hold it for you, if possible. Clearly mark and make sure that you can see: Any grab bars or handrails. First and last steps. Where the edge of each step is. Use tools that help you move around (mobility aids) if they are needed. These include: Canes. Walkers. Scooters. Crutches. Turn on the lights when you go into a dark area. Replace any light bulbs as soon as they burn out. Set up your furniture so you have a clear path. Avoid moving your furniture around. If any of your floors are uneven, fix them. If there are any pets around you, be aware of where they are. Review your medicines with your doctor. Some medicines can make you feel dizzy. This can increase your chance of falling. Ask your doctor what other things that you can do to help prevent falls. This information is not intended to replace advice given to you by your health care provider. Make sure you discuss any questions you have with your health care provider. Document Released: 05/17/2009 Document Revised: 12/27/2015 Document Reviewed: 08/25/2014 Elsevier Interactive Patient Education  2017 [employer].

## 2023-01-07 NOTE — Progress Notes (Unsigned)
Subjective:   Courtney Moon is a 67 y.o. female who presents for an Initial Medicare Annual Wellness Visit.  Review of Systems    ***       Objective:    There were no vitals filed for this visit. There is no height or weight on file to calculate BMI.     08/16/2020    1:56 PM  Advanced Directives  Does Patient Have a Medical Advance Directive? No    Current Medications (verified) Outpatient Encounter Medications as of 01/08/2023  Medication Sig   aspirin EC 81 MG tablet Take 81 mg by mouth daily. Swallow whole.   ASTAXANTHIN PO Take by mouth. Pt taking 12mg    atorvastatin (LIPITOR) 10 MG tablet Take 10 mg by mouth daily.   B Complex-C (B-COMPLEX WITH VITAMIN C) tablet Take 1 tablet by mouth daily.   Berberine Chloride 500 MG CAPS Take by mouth.   carvedilol (COREG) 3.125 MG tablet Take 3.125 mg by mouth 2 (two) times daily.   Cholecalciferol (VITAMIN D3) 25 MCG (1000 UT) CAPS Take 1 capsule (1,000 Units total) by mouth daily.   ezetimibe (ZETIA) 10 MG tablet Take 10 mg by mouth daily.   fish oil-omega-3 fatty acids 1000 MG capsule Take 2 capsules (2 g total) by mouth daily.   levothyroxine (SYNTHROID) 75 MCG tablet Take 1 tablet (75 mcg total) by mouth daily before breakfast.   losartan (COZAAR) 25 MG tablet Take 1 tablet (25 mg total) by mouth daily. Take along with the 50 mg tablet to equal 75 mg daily   losartan (COZAAR) 50 MG tablet Take 1 tablet (50 mg total) by mouth daily. Take along with the 25 mg tablet to equal 75 mg daily   pantoprazole (PROTONIX) 40 MG tablet Take 40 mg by mouth daily.   No facility-administered encounter medications on file as of 01/08/2023.    Allergies (verified) Oxycodone   History: Past Medical History:  Diagnosis Date   Anxiety    Arthritis    Bradycardia    Chronic UTI    Gastritis    Hearing loss    HLD (hyperlipidemia)    Hypercholesteremia    Hypertension    Orthostatic hypertension    Thyroid disorder    Past Surgical  History:  Procedure Laterality Date   CHOLECYSTECTOMY     ROTATOR CUFF REPAIR     Family History  Problem Relation Age of Onset   Diabetes Mother    Heart disease Mother    Hypothyroidism Mother    Diabetes Father    Diabetes Sister    Hypothyroidism Sister    Heart disease Maternal Grandfather    Cancer Paternal Grandmother    Social History   Socioeconomic History   Marital status: Married    Spouse name: Not on file   Number of children: Not on file   Years of education: Not on file   Highest education level: Not on file  Occupational History   Not on file  Tobacco Use   Smoking status: Never   Smokeless tobacco: Never  Vaping Use   Vaping Use: Never used  Substance and Sexual Activity   Alcohol use: Never   Drug use: Never   Sexual activity: Not on file  Other Topics Concern   Not on file  Social History Narrative   Not on file   Social Determinants of Health   Financial Resource Strain: Not on file  Food Insecurity: Not on file  Transportation Needs: Not  on file  Physical Activity: Not on file  Stress: Not on file  Social Connections: Not on file    Tobacco Counseling Counseling given: Not Answered   Clinical Intake:              How often do you need to have someone help you when you read instructions, pamphlets, or other written materials from your doctor or pharmacy?: (P) 1 - Never  Diabetic?No          Activities of Daily Living    01/07/2023    5:03 PM  In your present state of health, do you have any difficulty performing the following activities:  Hearing? 0  Vision? 0  Difficulty concentrating or making decisions? 0  Walking or climbing stairs? 1  Dressing or bathing? 0  Doing errands, shopping? 1  Preparing Food and eating ? N  Using the Toilet? N  In the past six months, have you accidently leaked urine? Y  Do you have problems with loss of bowel control? N  Managing your Medications? N  Managing your Finances? N   Housekeeping or managing your Housekeeping? N    Patient Care Team: Park Meo, FNP as PCP - General (Family Medicine) Meriam Sprague, MD as PCP - Cardiology (Cardiology)  Indicate any recent Medical Services you may have received from other than [facility] providers in the past year (date may be approximate).     Assessment:   This is a routine wellness examination for [facility].  Hearing/Vision screen No results found.  Dietary issues and exercise activities discussed:     Goals Addressed   None    Depression Screen    12/18/2022    9:40 AM  PHQ 2/9 Scores  PHQ - 2 Score 0  PHQ- 9 Score 0    Fall Risk    01/07/2023    5:03 PM 12/18/2022    9:40 AM  Fall Risk   Falls in the past year? 0 1  Number falls in past yr: 0 0  Injury with Fall?  0  Risk for fall due to :  No Fall Risks  Follow up  Falls evaluation completed;Education provided    FALL RISK PREVENTION PERTAINING TO THE HOME:  Any stairs in or around the home? {YES/NO:21197} If so, are there any without handrails? {YES/NO:21197} Home free of loose throw rugs in walkways, pet beds, electrical cords, etc? {YES/NO:21197} Adequate lighting in your home to reduce risk of falls? {YES/NO:21197}  ASSISTIVE DEVICES UTILIZED TO PREVENT FALLS:  Life alert? {YES/NO:21197} Use of a cane, walker or w/c? {YES/NO:21197} Grab bars in the bathroom? {YES/NO:21197} Shower chair or bench in shower? {YES/NO:21197} Elevated toilet seat or a handicapped toilet? {YES/NO:21197}  TIMED UP AND GO:  Was the test performed? {YES/NO:21197}.  Length of time to ambulate 10 feet: *** sec.   {Appearance of ZOXW:9604540}  Cognitive Function:        Immunizations  There is no immunization history on file for this patient.  TDAP status: Due, Education has been provided regarding the importance of this vaccine. Advised may receive this vaccine at local pharmacy or Health Dept. Aware to provide a copy of the vaccination  record if obtained from local pharmacy or Health Dept. Verbalized acceptance and understanding.  {Pneumococcal vaccine status:2101807}  Covid-19 vaccine status: Declined, Education has been provided regarding the importance of this vaccine but patient still declined. Advised may receive this vaccine at local pharmacy or Health Dept.or vaccine clinic. Aware to provide  a copy of the vaccination record if obtained from local pharmacy or Health Dept. Verbalized acceptance and understanding.  Qualifies for Shingles Vaccine? Yes   Zostavax completed No   Shingrix Completed?: No.    Education has been provided regarding the importance of this vaccine. Patient has been advised to call insurance company to determine out of pocket expense if they have not yet received this vaccine. Advised may also receive vaccine at local pharmacy or Health Dept. Verbalized acceptance and understanding.  Screening Tests Health Maintenance  Topic Date Due   Medicare Annual Wellness (AWV)  Never done   DEXA SCAN  Never done   Pneumonia Vaccine 27+ Years old (1 of 1 - PCV) 07/20/2023 (Originally 08/23/2018)   Zoster Vaccines- Shingrix (1 of 2) 12/04/2023 (Originally 08/23/1974)   INFLUENZA VACCINE  03/05/2023   MAMMOGRAM  02/14/2024   Colonoscopy  07/10/2032   Hepatitis C Screening  Completed   HPV VACCINES  Aged Out   DTaP/Tdap/Td  Discontinued   COVID-19 Vaccine  Discontinued    Health Maintenance  Health Maintenance Due  Topic Date Due   Medicare Annual Wellness (AWV)  Never done   DEXA SCAN  Never done    Colorectal cancer screening: Type of screening: Colonoscopy. Completed 07/10/22. Repeat every 10 years  Mammogram status: Completed 02/13/22. Repeat every year  Bone Density status: Ordered and scheduled for 04/21/23. Pt provided with contact info and advised to call to schedule appt.  Lung Cancer Screening: (Low Dose CT Chest recommended if Age 25-80 years, 30 pack-year currently smoking OR have quit  w/in 15years.) does not qualify.   Lung Cancer Screening Referral: n/a  Additional Screening:  Hepatitis C Screening: does qualify; Completed 11/14/22  Vision Screening: Recommended annual ophthalmology exams for early detection of glaucoma and other disorders of the eye. Is the patient up to date with their annual eye exam?  {YES/NO:21197} Who is the provider or what is the name of the office in which the patient attends annual eye exams? *** If pt is not established with a provider, would they like to be referred to a provider to establish care? {YES/NO:21197}.   Dental Screening: Recommended annual dental exams for proper oral hygiene  Community Resource Referral / Chronic Care Management: CRR required this visit?  {YES/NO:21197}  CCM required this visit?  {YES/NO:21197}     Plan:     I have personally reviewed and noted the following in the patient's chart:   Medical and social history Use of alcohol, tobacco or illicit drugs  Current medications and supplements including opioid prescriptions. {Opioid [phone]} Functional ability and status Nutritional status Physical activity Advanced directives List of other physicians Hospitalizations, surgeries, and ER visits in previous 12 months Vitals Screenings to include cognitive, depression, and falls Referrals and appointments  In addition, I have reviewed and discussed with patient certain preventive protocols, quality metrics, and best practice recommendations. A written personalized care plan for preventive services as well as general preventive health recommendations were provided to patient.     Courtney Moon [place], [place]   03/05/[birth year]61   Nurse Notes: ***

## 2023-01-08 ENCOUNTER — Ambulatory Visit (INDEPENDENT_AMBULATORY_CARE_PROVIDER_SITE_OTHER): Payer: Medicare Other

## 2023-01-08 VITALS — Ht 61.0 in | Wt 200.0 lb

## 2023-01-08 DIAGNOSIS — Z0000 Encounter for general adult medical examination without abnormal findings: Secondary | ICD-10-CM | POA: Diagnosis not present

## 2023-01-31 IMAGING — CT CT CARDIAC CORONARY ARTERY CALCIUM SCORE
3 series · 14 of 20 positions shown, 15 images · non-contrast
Comparison: 08/16/2020 chest radiograph
COMPARISON: 08/16/2020 chest radiograph

Addendum:
EXAM:
OVER-READ INTERPRETATION  CT CHEST

The following report is an over-read performed by radiologist Dr.
Lutangu Malobela [REDACTED] on 02/13/2021. This over-read
does not include interpretation of cardiac or coronary anatomy or
pathology. The calcium score interpretation by the cardiologist is
attached.
CLINICAL DATA: Cardiovascular Disease Risk stratification
Coronary Calcium Score
TECHNIQUE: A gated, non-contrast computed tomography scan of the heart was
performed using 3mm slice thickness. Axial images were analyzed on a
dedicated workstation. Calcium scoring of the coronary arteries was
performed using the Agatston method.

[Series 2: casc 3.0 bv41 2 bestdiast 68 % · axial · 0.45mm/px · z∈[-170,-80]mm · 4 of 52 slices shown, 5 images]
[im 11/52  vessel]
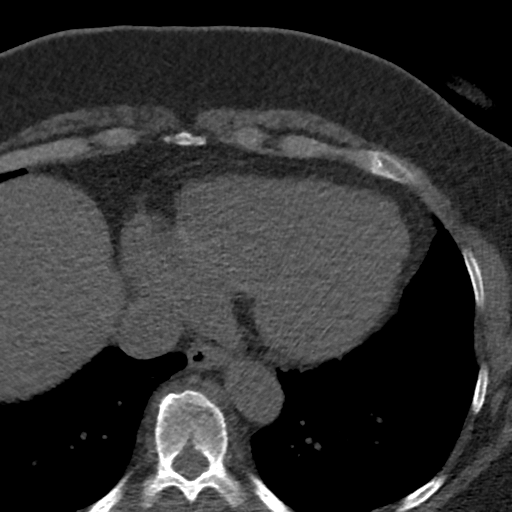
[im 11/52  lung]
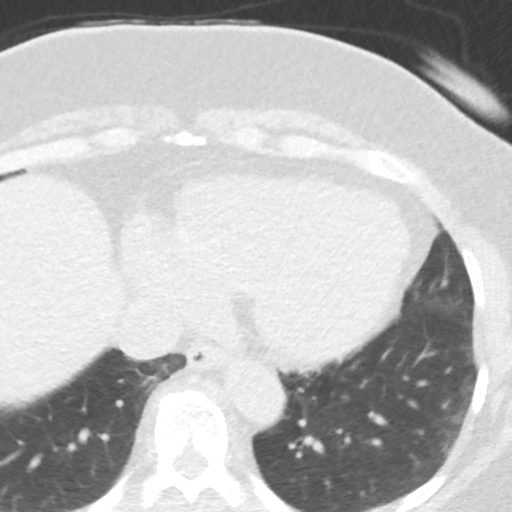
[im 21/52  vessel]
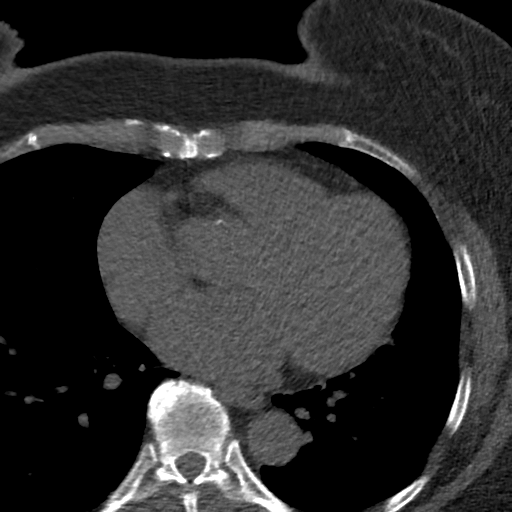
[im 31/52  vessel]
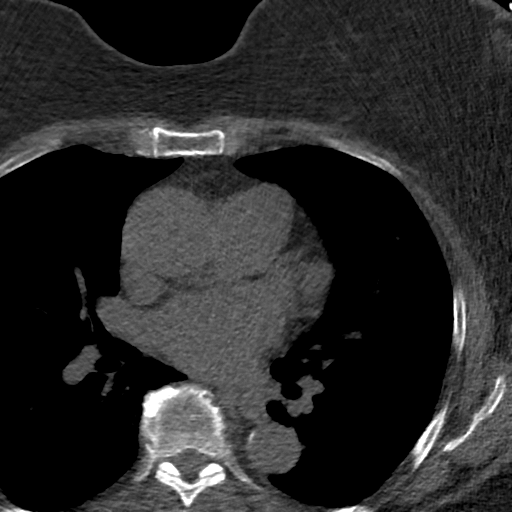
[im 41/52  vessel]
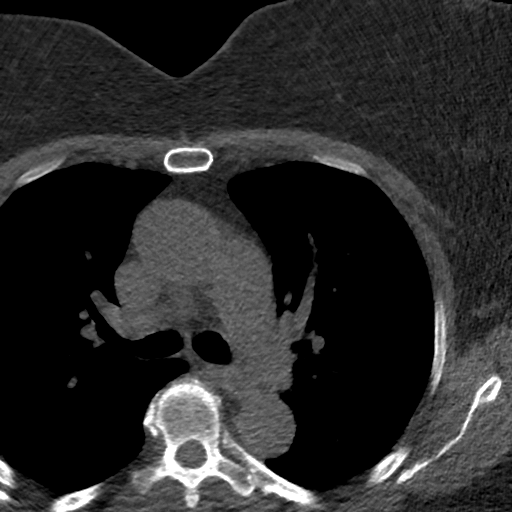

[Series 3: lung 68 % · axial · 0.68mm/px · z∈[-176,-74]mm · 5 of 52 slices shown]
[im 9/52  lung]
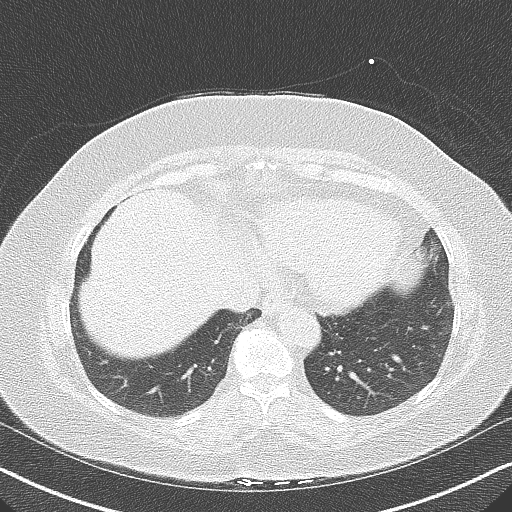
[im 18/52  lung]
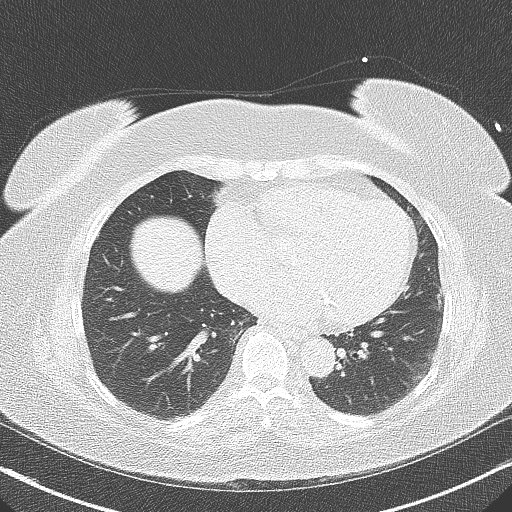
[im 26/52  lung]
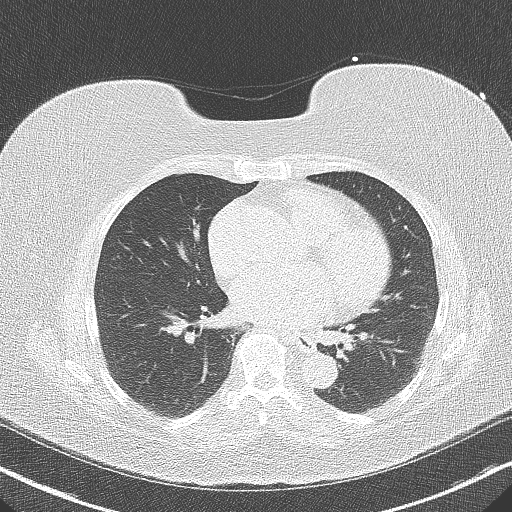
[im 35/52  lung]
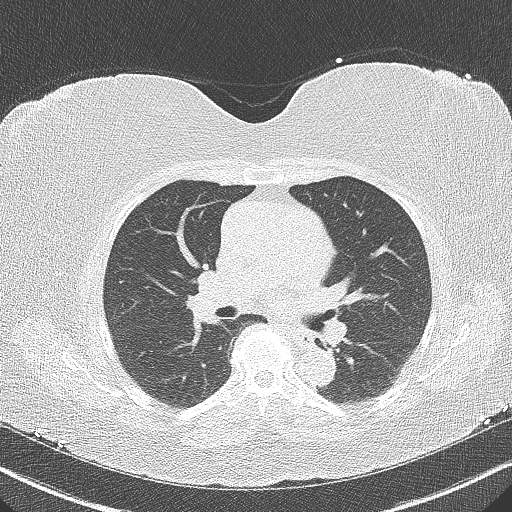
[im 43/52  lung]
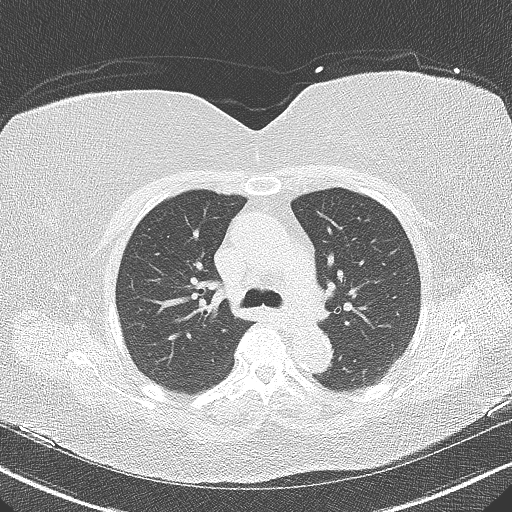

[Series 4: lung st 68 % · axial · 0.68mm/px · z∈[-176,-74]mm · 5 of 52 slices shown]
[im 9/52  lung]
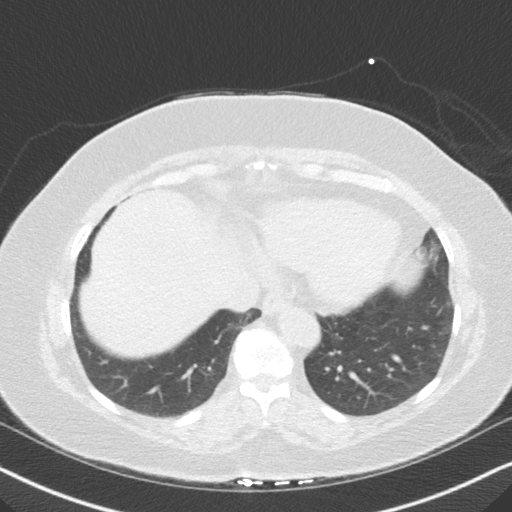
[im 18/52  lung]
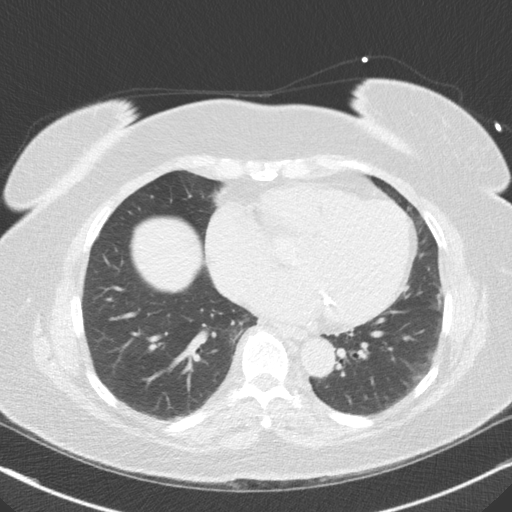
[im 26/52  lung]
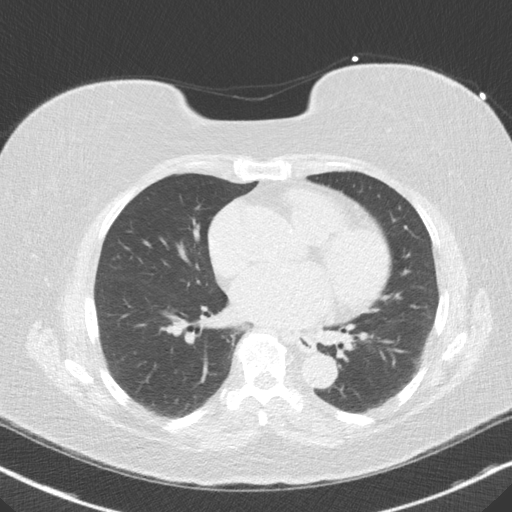
[im 35/52  lung]
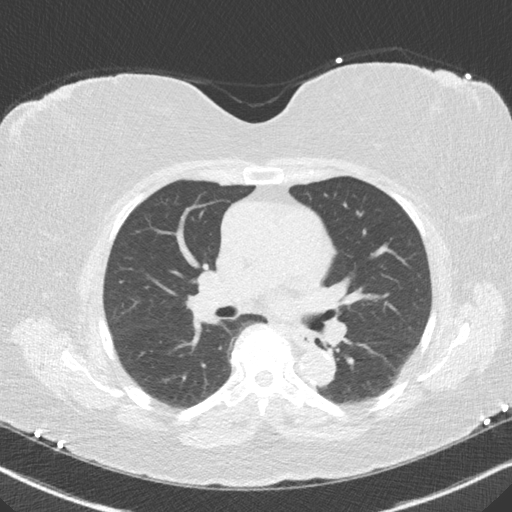
[im 43/52  lung]
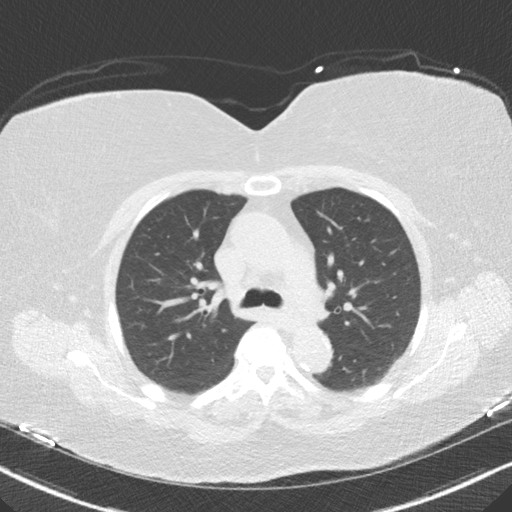

[14 of 20 positions shown; findings below may reference images not displayed]

FINDINGS: Vascular: Aortic atherosclerosis.

Mediastinum/Nodes: No imaged thoracic adenopathy.

Lungs/Pleura: No pleural fluid.  Clear imaged lungs.

Upper Abdomen: Normal imaged portions of the liver, spleen, stomach.

Musculoskeletal: Mid and lower thoracic spondylosis.
IMPRESSION: No acute findings in the imaged extracardiac chest.

Aortic Atherosclerosis (1Y3N8-YXZ.Z).
FINDINGS: Coronary Calcium Score:

Left main: 0

Left anterior descending artery:

Left circumflex artery: 0

Right coronary artery: 0

Total:

Percentile: 67th

Pericardium: Normal.

Ascending Aorta: Normal caliber.

Non-cardiac: See separate report from [REDACTED].
IMPRESSION: Coronary calcium score of 11.9. This was 67th percentile for age-,
race-, and sex-matched controls.



If CAC=0, it is reasonable to withhold statin therapy and reassess
in 5 to 10 years, as long as higher risk conditions are absent
(diabetes mellitus, family history of premature CHD in first degree
relatives (males <55 years; females <65 years), cigarette smoking,
or LDL >=190 mg/dL).

If CAC is 1 to 99, it is reasonable to initiate statin therapy for
patients >=55 years of age.

If CAC is >=100 or >=75th percentile, it is reasonable to initiate
statin therapy at any age.

Cardiology referral should be considered for patients with CAC
scores >=400 or >=75th percentile.

*7072 AHA/ACC/AACVPR/AAPA/ABC/THAIGO/ISA/RONGE/[place]/JTE/YULINIO/LAST
Guideline on the Management of Blood Cholesterol: A Report of the
American College of Cardiology/American Heart Association Task Force
on Clinical Practice Guidelines. J Am Coll Cardiol.
4362;73(24):4937-4686.

*** End of Addendum ***
EXAM:
OVER-READ INTERPRETATION  CT CHEST

The following report is an over-read performed by radiologist Dr.
Lutangu Malobela [REDACTED] on 02/13/2021. This over-read
does not include interpretation of cardiac or coronary anatomy or
pathology. The calcium score interpretation by the cardiologist is
attached.
FINDINGS: Vascular: Aortic atherosclerosis.

Mediastinum/Nodes: No imaged thoracic adenopathy.

Lungs/Pleura: No pleural fluid.  Clear imaged lungs.

Upper Abdomen: Normal imaged portions of the liver, spleen, stomach.

Musculoskeletal: Mid and lower thoracic spondylosis.
IMPRESSION: No acute findings in the imaged extracardiac chest.

Aortic Atherosclerosis (1Y3N8-YXZ.Z).

## 2023-02-13 ENCOUNTER — Other Ambulatory Visit: Payer: Self-pay

## 2023-02-13 ENCOUNTER — Telehealth: Payer: Self-pay | Admitting: Family Medicine

## 2023-02-13 DIAGNOSIS — I2510 Atherosclerotic heart disease of native coronary artery without angina pectoris: Secondary | ICD-10-CM

## 2023-02-13 DIAGNOSIS — E785 Hyperlipidemia, unspecified: Secondary | ICD-10-CM

## 2023-02-13 DIAGNOSIS — I119 Hypertensive heart disease without heart failure: Secondary | ICD-10-CM

## 2023-02-13 MED ORDER — ATORVASTATIN CALCIUM 10 MG PO TABS
10.0000 mg | ORAL_TABLET | Freq: Every day | ORAL | 1 refills | Status: DC
Start: 2023-02-13 — End: 2023-07-06

## 2023-02-13 MED ORDER — EZETIMIBE 10 MG PO TABS
10.0000 mg | ORAL_TABLET | Freq: Every day | ORAL | 1 refills | Status: DC
Start: 2023-02-13 — End: 2023-07-06

## 2023-02-13 MED ORDER — CARVEDILOL 3.125 MG PO TABS
3.1250 mg | ORAL_TABLET | Freq: Two times a day (BID) | ORAL | 1 refills | Status: DC
Start: 2023-02-13 — End: 2023-06-29

## 2023-02-13 NOTE — Telephone Encounter (Signed)
Prescription Request  02/13/2023  LOV: 12/18/2022  What is the name of the medication or equipment?   atorvastatin (LIPITOR) 10 MG tablet   ezetimibe (ZETIA) 10 MG tablet   carvedilol (COREG) 3.125 MG tablet   Fluticasone Prop. SPR *   Have you contacted your pharmacy to request a refill? Yes   Which pharmacy would you like this sent to?  [facility] [place], [place] - [address] [place] [address] [place] [address] Phone: [phone] Fax: [phone]    Patient notified that their request is being sent to the clinical staff for review and that they should receive a response within 2 business days.   Please advise pharmacist.

## 2023-02-13 NOTE — Telephone Encounter (Signed)
Fluticasone Prop. SPR * requested and not on current list. All other medications ordered today and sent in a separate encounter.

## 2023-02-16 ENCOUNTER — Other Ambulatory Visit: Payer: Self-pay | Admitting: Family Medicine

## 2023-02-16 MED ORDER — FLUTICASONE PROPIONATE 50 MCG/ACT NA SUSP: 2.0000 | Freq: Every day | NASAL | 6 refills | Status: DC

## 2023-03-09 IMAGING — US US RENAL ARTERY STENOSIS
1 series · 13 of 25 positions shown · non-contrast
Comparison: None.

CLINICAL DATA: Uncontrolled hypertension

EXAM:
RENAL/URINARY TRACT ULTRASOUND
RENAL DUPLEX DOPPLER ULTRASOUND

[Series 1: us renal artery stenosis · 0.23mm/px · 13 of 73 slices shown]
[im 1/73]
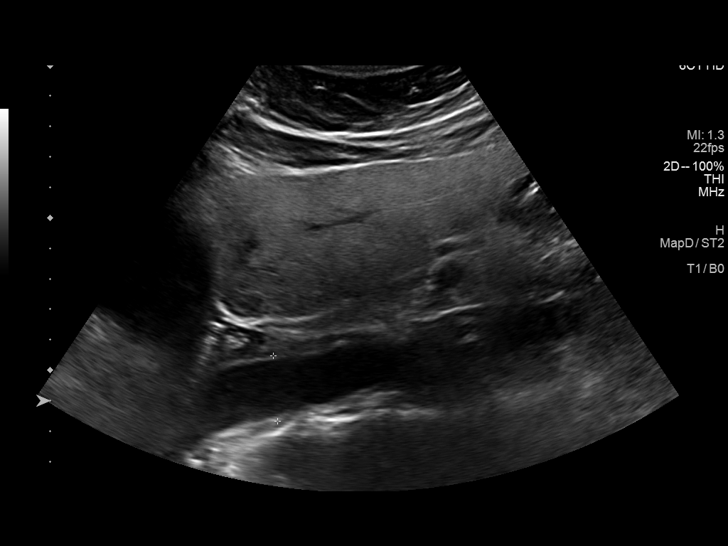
[im 7/73]
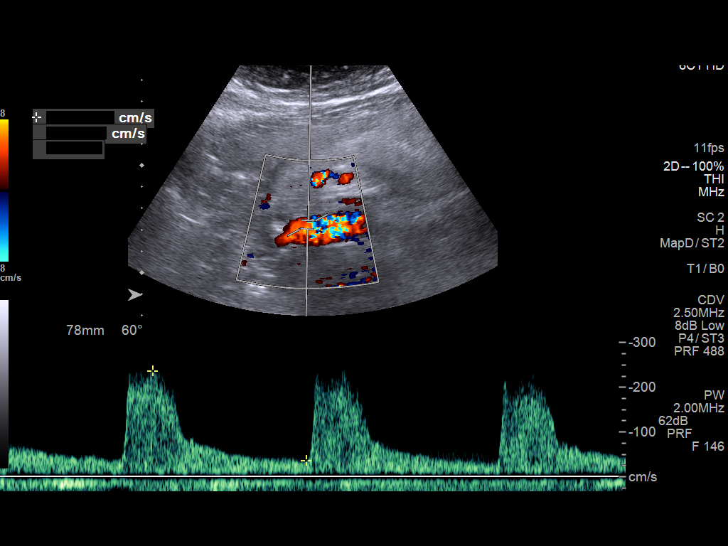
[im 13/73]
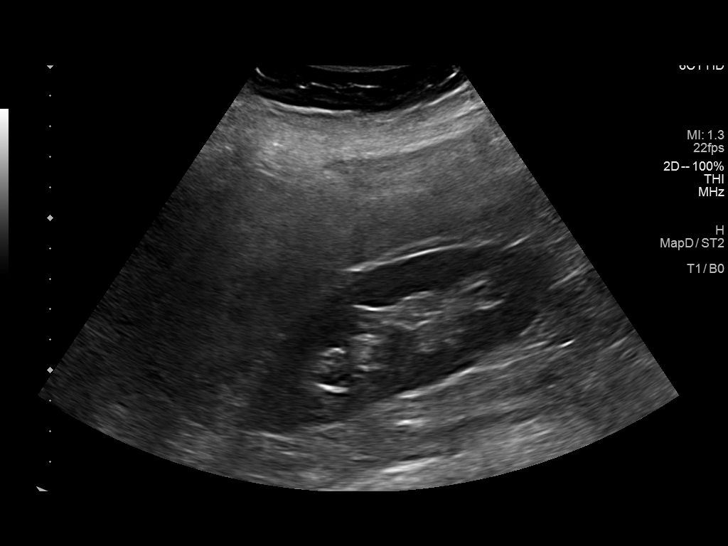
[im 19/73]
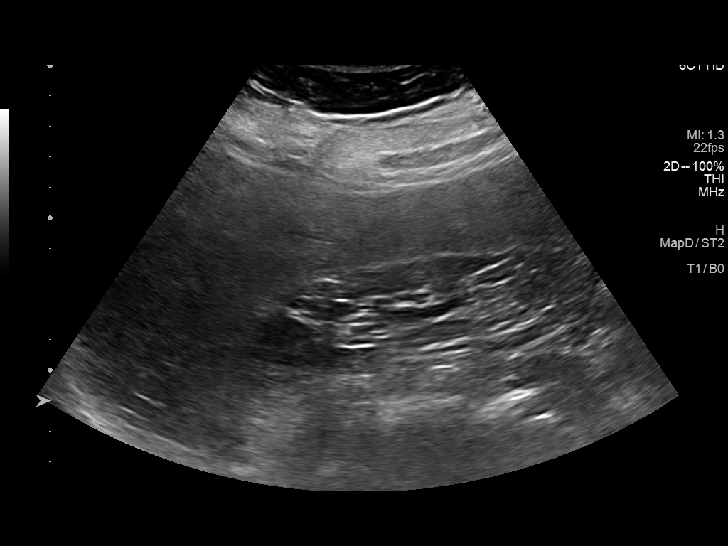
[im 25/73]
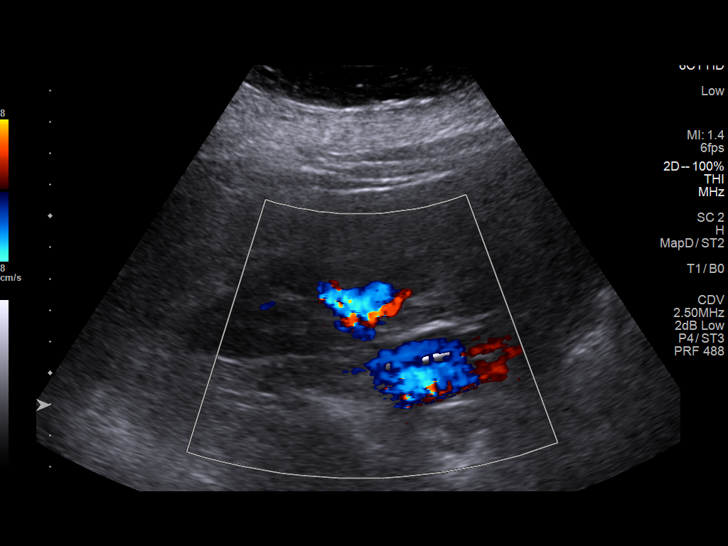
[im 31/73]
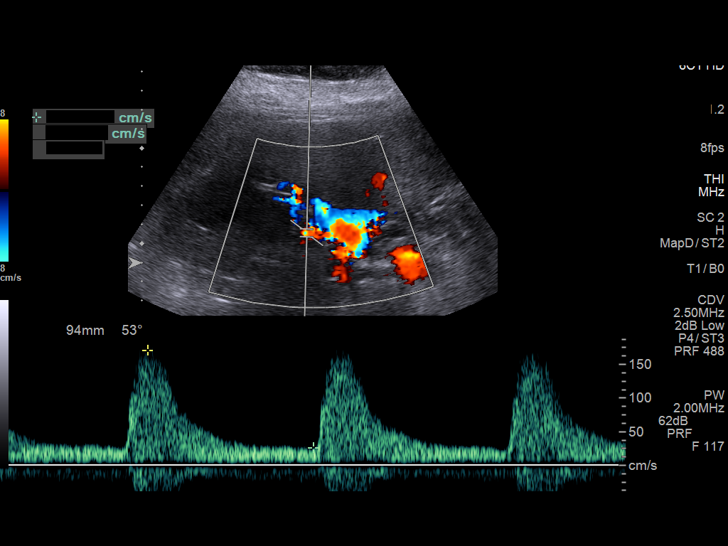
[im 37/73]
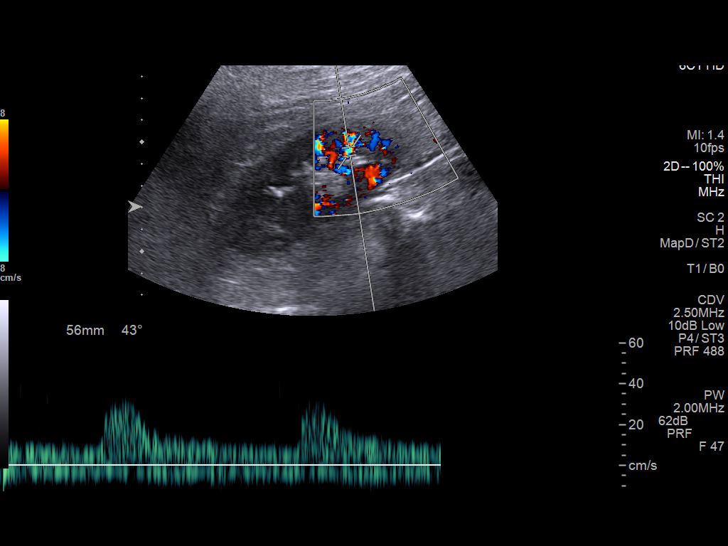
[im 43/73]
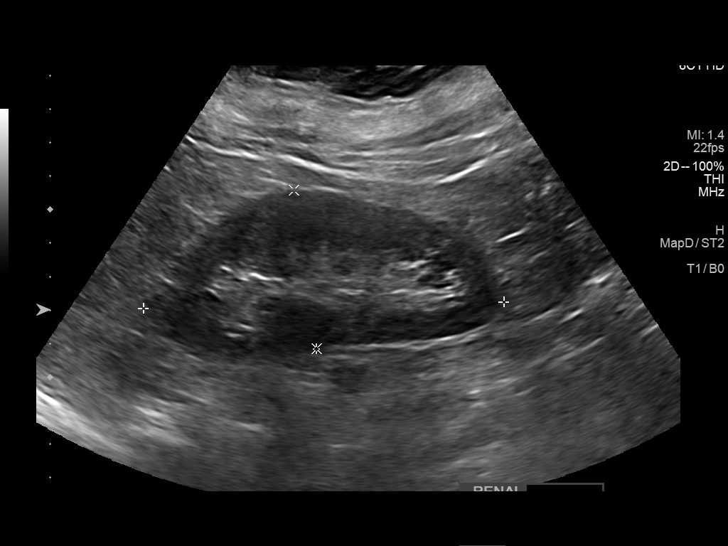
[im 49/73]
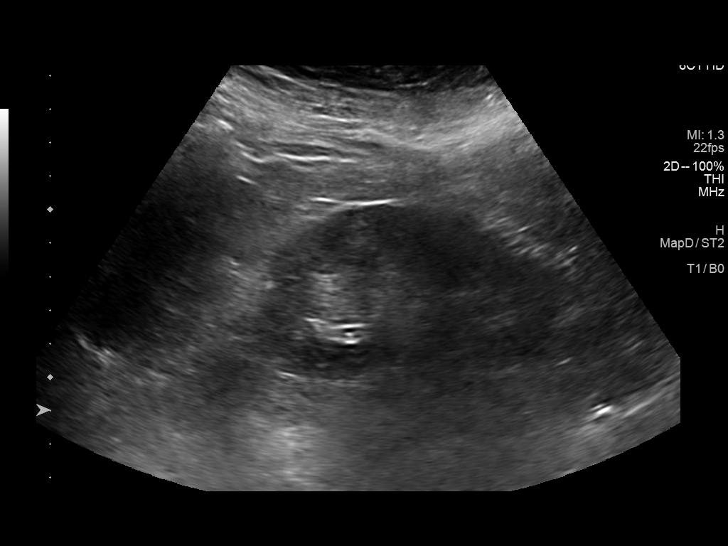
[im 55/73]
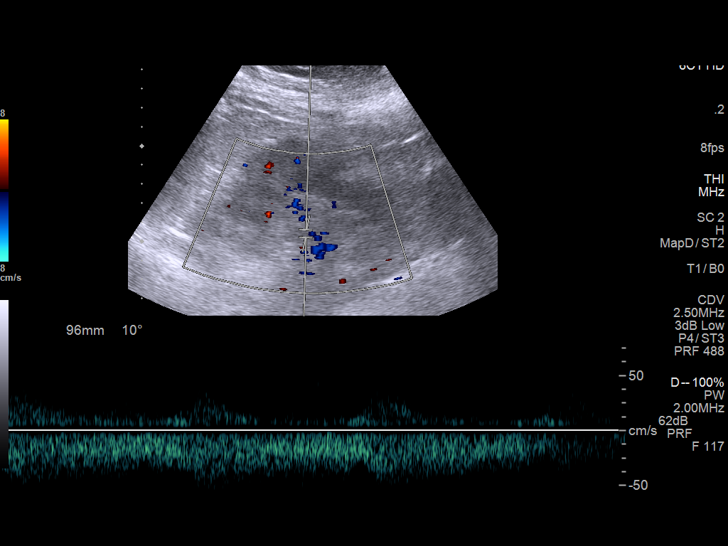
[im 61/73]
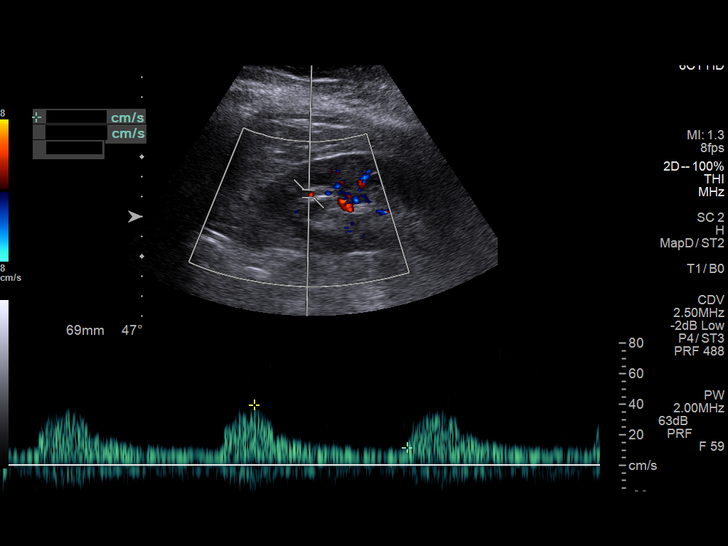
[im 67/73]
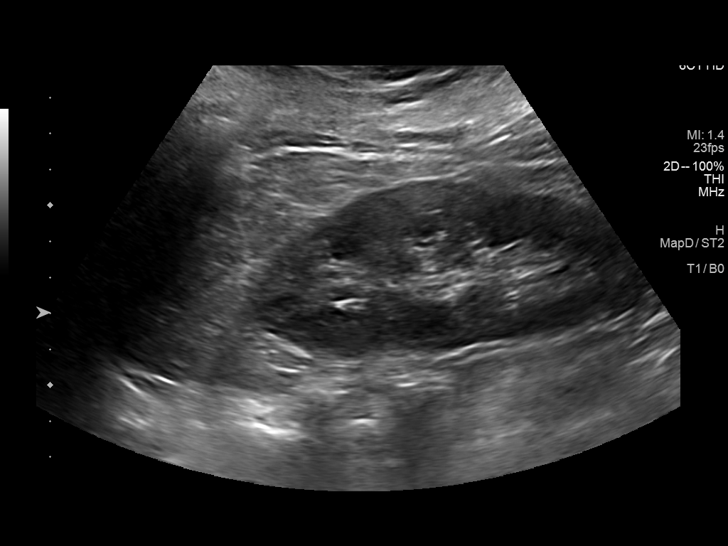
[im 73/73]
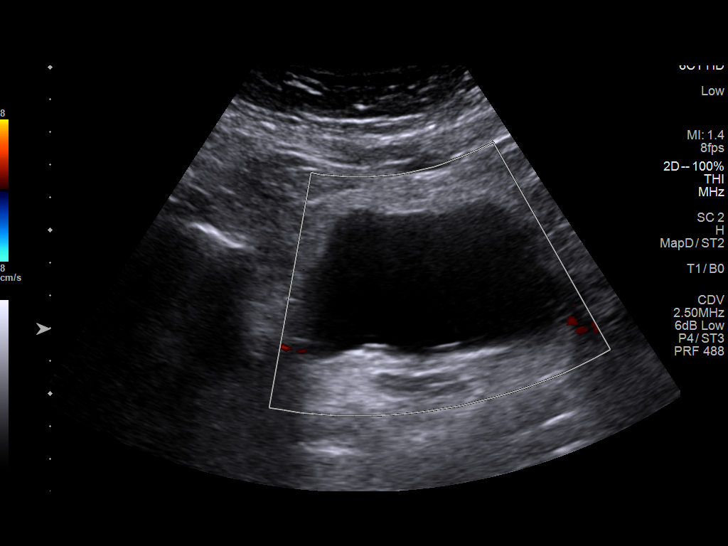

[13 of 25 positions shown; findings below may reference images not displayed]

FINDINGS: Right Kidney:

Length: 11.6 cm. Echogenicity within normal limits. No mass or
hydronephrosis visualized.

Left Kidney:

Length: 10.8 cm. Echogenicity within normal limits. No mass or
hydronephrosis visualized.

Bladder:  Normal appearance by ultrasound

RENAL DUPLEX ULTRASOUND

Right Renal Artery Velocities:

Origin:  237 cm/sec

Mid:  171 cm/sec

Hilum:  54 cm/sec

Interlobar:  39 cm/sec

Arcuate:  24 cm/sec

Left Renal Artery Velocities:

Origin:  212 cm/sec

Mid:  127 cm/sec

Hilum:  81 cm/sec

Interlobar:  40 cm/sec

Arcuate:  27 cm/sec

Aortic Velocity:  72 cm/sec

Right Renal-Aortic Ratios:

Origin:

Mid:

Hilum:

Interlobar:

Arcuate:

Left Renal-Aortic Ratios:

Origin:

Mid:

Hilum:

Interlobar:

Arcuate:

Renal artery origin peak systolic velocities are greater than 200
centimeters/second bilaterally with elevated ratios as above
indicative of some degree of bilateral renal artery stenosis.

Renal veins appear patent. No acute renal abnormality or
hydronephrosis. Bladder unremarkable.
IMPRESSION: Positive exam for some degree of origin renal artery stenosis
bilaterally (peak systolic velocities are greater than 200
centimeters/second). Consider further evaluation with CTA.

## 2023-03-11 ENCOUNTER — Other Ambulatory Visit: Payer: Self-pay | Admitting: Family Medicine

## 2023-03-11 DIAGNOSIS — E039 Hypothyroidism, unspecified: Secondary | ICD-10-CM

## 2023-03-27 ENCOUNTER — Ambulatory Visit (HOSPITAL_COMMUNITY): Payer: Medicare Other | Attending: Physician Assistant

## 2023-03-27 DIAGNOSIS — I5030 Unspecified diastolic (congestive) heart failure: Secondary | ICD-10-CM

## 2023-03-27 DIAGNOSIS — I719 Aortic aneurysm of unspecified site, without rupture: Secondary | ICD-10-CM

## 2023-03-27 DIAGNOSIS — I517 Cardiomegaly: Secondary | ICD-10-CM | POA: Diagnosis present

## 2023-03-27 LAB — ECHOCARDIOGRAM COMPLETE: Area-P 1/2: 2.52 cm2

## 2023-04-21 ENCOUNTER — Other Ambulatory Visit: Payer: Medicare Other

## 2023-04-23 ENCOUNTER — Inpatient Hospital Stay: Admission: RE | Admit: 2023-04-23 | Payer: Medicare Other | Source: Ambulatory Visit

## 2023-04-24 ENCOUNTER — Other Ambulatory Visit (HOSPITAL_BASED_OUTPATIENT_CLINIC_OR_DEPARTMENT_OTHER): Payer: Self-pay | Admitting: Nephrology

## 2023-04-24 DIAGNOSIS — I129 Hypertensive chronic kidney disease with stage 1 through stage 4 chronic kidney disease, or unspecified chronic kidney disease: Secondary | ICD-10-CM

## 2023-04-24 DIAGNOSIS — I5032 Chronic diastolic (congestive) heart failure: Secondary | ICD-10-CM

## 2023-04-24 DIAGNOSIS — R7303 Prediabetes: Secondary | ICD-10-CM

## 2023-04-24 DIAGNOSIS — R809 Proteinuria, unspecified: Secondary | ICD-10-CM

## 2023-04-24 DIAGNOSIS — I77810 Thoracic aortic ectasia: Secondary | ICD-10-CM

## 2023-04-24 DIAGNOSIS — I701 Atherosclerosis of renal artery: Secondary | ICD-10-CM

## 2023-04-24 DIAGNOSIS — Q271 Congenital renal artery stenosis: Secondary | ICD-10-CM

## 2023-05-18 ENCOUNTER — Ambulatory Visit (HOSPITAL_COMMUNITY): Admission: RE | Admit: 2023-05-18 | Payer: Medicare Other | Source: Ambulatory Visit

## 2023-05-18 ENCOUNTER — Encounter (HOSPITAL_COMMUNITY): Payer: Self-pay

## 2023-06-03 ENCOUNTER — Institutional Professional Consult (permissible substitution): Payer: Medicare Other | Admitting: Nurse Practitioner

## 2023-06-15 ENCOUNTER — Ambulatory Visit (HOSPITAL_COMMUNITY): Payer: Medicare Other

## 2023-06-18 ENCOUNTER — Other Ambulatory Visit: Payer: Medicare Other

## 2023-06-25 ENCOUNTER — Ambulatory Visit: Payer: Medicare Other | Admitting: Family Medicine

## 2023-06-29 ENCOUNTER — Other Ambulatory Visit: Payer: Self-pay | Admitting: Family Medicine

## 2023-06-29 DIAGNOSIS — I119 Hypertensive heart disease without heart failure: Secondary | ICD-10-CM

## 2023-06-29 DIAGNOSIS — I2510 Atherosclerotic heart disease of native coronary artery without angina pectoris: Secondary | ICD-10-CM

## 2023-07-06 ENCOUNTER — Other Ambulatory Visit: Payer: Self-pay | Admitting: Family Medicine

## 2023-07-06 DIAGNOSIS — I2510 Atherosclerotic heart disease of native coronary artery without angina pectoris: Secondary | ICD-10-CM

## 2023-07-06 DIAGNOSIS — I119 Hypertensive heart disease without heart failure: Secondary | ICD-10-CM

## 2023-07-06 DIAGNOSIS — E785 Hyperlipidemia, unspecified: Secondary | ICD-10-CM

## 2023-07-09 ENCOUNTER — Other Ambulatory Visit: Payer: Medicare Other

## 2023-07-10 LAB — CBC WITH DIFFERENTIAL/PLATELET
Absolute Lymphocytes: 2508 {cells}/uL (ref 850–3900)
Absolute Monocytes: 722 {cells}/uL (ref 200–950)
Basophils Absolute: 53 {cells}/uL (ref 0–200)
Basophils Relative: 0.7 %
Eosinophils Absolute: 319 {cells}/uL (ref 15–500)
Eosinophils Relative: 4.2 %
HCT: 38.4 % (ref 35.0–45.0)
Hemoglobin: 12.4 g/dL (ref 11.7–15.5)
MCH: 27.7 pg (ref 27.0–33.0)
MCHC: 32.3 g/dL (ref 32.0–36.0)
MCV: 85.9 fL (ref 80.0–100.0)
MPV: 10.8 fL (ref 7.5–12.5)
Monocytes Relative: 9.5 %
Neutro Abs: 3998 {cells}/uL (ref 1500–7800)
Neutrophils Relative %: 52.6 %
Platelets: 325 10*3/uL (ref 140–400)
RBC: 4.47 10*6/uL (ref 3.80–5.10)
RDW: 13.7 % (ref 11.0–15.0)
Total Lymphocyte: 33 %
WBC: 7.6 10*3/uL (ref 3.8–10.8)

## 2023-07-10 LAB — COMPLETE METABOLIC PANEL WITH GFR
AG Ratio: 1.5 (calc) (ref 1.0–2.5)
ALT: 15 U/L (ref 6–29)
AST: 19 U/L (ref 10–35)
Albumin: 4.1 g/dL (ref 3.6–5.1)
Alkaline phosphatase (APISO): 109 U/L (ref 37–153)
BUN: 21 mg/dL (ref 7–25)
CO2: 27 mmol/L (ref 20–32)
Calcium: 9.3 mg/dL (ref 8.6–10.4)
Chloride: 105 mmol/L (ref 98–110)
Creat: 0.78 mg/dL (ref 0.50–1.05)
Globulin: 2.7 g/dL (ref 1.9–3.7)
Glucose, Bld: 94 mg/dL (ref 65–99)
Potassium: 4.2 mmol/L (ref 3.5–5.3)
Sodium: 142 mmol/L (ref 135–146)
Total Bilirubin: 0.7 mg/dL (ref 0.2–1.2)
Total Protein: 6.8 g/dL (ref 6.1–8.1)
eGFR: 82 mL/min/{1.73_m2} (ref >=60)

## 2023-07-10 LAB — LIPID PANEL
Cholesterol: 126 mg/dL (ref <200)
HDL: 53 mg/dL (ref >=50)
LDL Cholesterol (Calc): 53 mg/dL
Non-HDL Cholesterol (Calc): 73 mg/dL (ref <130)
Total CHOL/HDL Ratio: 2.4 (calc) (ref <5.0)
Triglycerides: 113 mg/dL (ref <150)

## 2023-07-10 LAB — TSH: TSH: 2.88 m[IU]/L (ref 0.40–4.50)

## 2023-07-16 ENCOUNTER — Ambulatory Visit: Payer: Medicare Other | Admitting: Family Medicine

## 2023-08-04 ENCOUNTER — Institutional Professional Consult (permissible substitution): Payer: Medicare Other | Admitting: Nurse Practitioner

## 2023-08-06 ENCOUNTER — Ambulatory Visit: Payer: Medicare Other | Admitting: Family Medicine

## 2023-08-11 ENCOUNTER — Other Ambulatory Visit: Payer: Self-pay | Admitting: Family Medicine

## 2023-08-11 ENCOUNTER — Other Ambulatory Visit: Payer: Self-pay | Admitting: Physician Assistant

## 2023-08-13 ENCOUNTER — Encounter: Payer: Self-pay | Admitting: Family Medicine

## 2023-08-13 ENCOUNTER — Ambulatory Visit: Payer: Medicare Other | Admitting: Family Medicine

## 2023-08-13 VITALS — BP 162/80 | Temp 98.4°F | Ht 61.0 in | Wt 211.0 lb

## 2023-08-13 DIAGNOSIS — E039 Hypothyroidism, unspecified: Secondary | ICD-10-CM | POA: Diagnosis not present

## 2023-08-13 DIAGNOSIS — E785 Hyperlipidemia, unspecified: Secondary | ICD-10-CM

## 2023-08-13 DIAGNOSIS — I119 Hypertensive heart disease without heart failure: Secondary | ICD-10-CM | POA: Diagnosis not present

## 2023-08-13 DIAGNOSIS — I719 Aortic aneurysm of unspecified site, without rupture: Secondary | ICD-10-CM | POA: Diagnosis not present

## 2023-08-13 MED ORDER — IBUPROFEN 800 MG PO TABS: 800.0000 mg | ORAL_TABLET | Freq: Three times a day (TID) | ORAL | 0 refills | Status: DC | PRN

## 2023-08-13 MED ORDER — LEVOTHYROXINE SODIUM 75 MCG PO TABS: 75.0000 ug | ORAL_TABLET | Freq: Every day | ORAL | 1 refills | Status: DC

## 2023-08-13 NOTE — Assessment & Plan Note (Signed)
 Well controlled on Coreg  3.125mg  BID and Cozaar  75mg  daily. Taking ASA, Atorvastatin , Zetia , fish oil. Recommend heart healthy diet such as Mediterranean diet with whole grains, fruits, vegetable, fish, lean meats, nuts, and olive oil. Limit salt. Encouraged moderate walking, 3-5 times/week for 30-50 minutes each session. Aim for at least 150 minutes.week. Goal should be pace of 3 miles/hours, or walking 1.5 miles in 30 minutes. Avoid tobacco products. Avoid excess alcohol. Take medications as prescribed and bring medications and blood pressure log with cuff to each office visit. Seek medical care for chest pain, palpitations, shortness of breath with exertion, dizziness/lightheadedness, vision changes, recurrent headaches, or swelling of extremities.

## 2023-08-13 NOTE — Progress Notes (Signed)
 Subjective:  HPI: Courtney Moon is a 68 y.o. female presenting on 08/13/2023 for Follow-up (6 month follow up)   HPI Patient is in today for follow up.   HYPERTENSION / HYPERLIPIDEMIA Satisfied with current treatment? yes Duration of hypertension: chronic BP monitoring frequency: daily BP range: 130/85, 128/78, <130/80 BP medication side effects: no Past BP meds: carvedilol  and losartan  (cozaar ) Duration of hyperlipidemia: chronic Cholesterol medication side effects: no Cholesterol supplements: fish oil Past cholesterol medications: atorvastain (lipitor) and ezetimide (zetia ) Medication compliance: excellent compliance Aspirin: no Recent stressors: no Recurrent headaches: yes Visual changes: no Palpitations: no Dyspnea: no Chest pain: no Lower extremity edema: no Dizzy/lightheaded: no  HYPOTHYROIDISM Thyroid  control status:controlled Satisfied with current treatment? yes Medication side effects: no Medication compliance: excellent compliance Etiology of hypothyroidism:  Recent dose adjustment:no Fatigue: no Cold intolerance: no Heat intolerance: no Weight gain: no Weight loss: no Constipation: no Diarrhea/loose stools: no Palpitations: no Lower extremity edema: no Anxiety/depressed mood: no  DIABETES Hypoglycemic episodes:no Polydipsia/polyuria: no Visual disturbance: no Chest pain: no Paresthesias: no Glucose Monitoring: yes  Accucheck frequency: Daily  Fasting glucose: 82-98  Post prandial:  Evening:  Before meals: Taking Insulin?: no  Long acting insulin:  Short acting insulin: Blood Pressure Monitoring: daily Retinal Examination: Up to Date Foot Exam: Up to Date Diabetic Education: Completed Pneumovax: Up to Date Influenza:  declined Aspirin: yes   Review of Systems  All other systems reviewed and are negative.   Relevant past medical history reviewed and updated as indicated.   Past Medical History:  Diagnosis Date   Anxiety     Arthritis    Bradycardia    Chronic UTI    Gastritis    Hearing loss    HLD (hyperlipidemia)    Hypercholesteremia    Hypertension    Orthostatic hypertension    Thyroid  disorder      Past Surgical History:  Procedure Laterality Date   CHOLECYSTECTOMY     ROTATOR CUFF REPAIR      Allergies and medications reviewed and updated.   Current Outpatient Medications:    aspirin EC 81 MG tablet, Take 81 mg by mouth daily. Swallow whole., Disp: , Rfl:    ASTAXANTHIN PO, Take by mouth. Pt taking 12mg , Disp: , Rfl:    atorvastatin  (LIPITOR) 10 MG tablet, TAKE 1 TABLET BY MOUTH DAILY, Disp: 100 tablet, Rfl: 2   B Complex-C (B-COMPLEX WITH VITAMIN C) tablet, Take 1 tablet by mouth daily., Disp: , Rfl:    Berberine Chloride 500 MG CAPS, Take by mouth., Disp: , Rfl:    carvedilol  (COREG ) 3.125 MG tablet, TAKE 1 TABLET BY MOUTH TWICE  DAILY, Disp: 180 tablet, Rfl: 3   Cholecalciferol (VITAMIN D3) 25 MCG (1000 UT) CAPS, Take 1 capsule (1,000 Units total) by mouth daily., Disp: 60 capsule, Rfl: 0   ezetimibe  (ZETIA ) 10 MG tablet, TAKE 1 TABLET BY MOUTH DAILY, Disp: 100 tablet, Rfl: 2   fish oil-omega-3 fatty acids  1000 MG capsule, Take 2 capsules (2 g total) by mouth daily., Disp: , Rfl:    fluticasone  (FLONASE ) 50 MCG/ACT nasal spray, USE 2 SPRAYS IN BOTH NOSTRILS  DAILY, Disp: 48 g, Rfl: 0   losartan  (COZAAR ) 25 MG tablet, Take 1 tablet (25 mg total) by mouth daily. Take along with the 50 mg tablet to equal 75 mg daily, Disp: 90 tablet, Rfl: 3   losartan  (COZAAR ) 50 MG tablet, Take 1 tablet (50 mg total) by mouth daily. Take along with the 25  mg tablet to equal 75 mg daily, Disp: 90 tablet, Rfl: 3   pantoprazole (PROTONIX) 40 MG tablet, Take 40 mg by mouth daily., Disp: , Rfl:    ibuprofen  (ADVIL ) 800 MG tablet, Take 1 tablet (800 mg total) by mouth every 8 (eight) hours as needed for moderate pain (pain score 4-6)., Disp: 30 tablet, Rfl: 0   levothyroxine  (SYNTHROID ) 75 MCG tablet, Take 1 tablet  (75 mcg total) by mouth daily before breakfast., Disp: 100 tablet, Rfl: 1  Allergies  Allergen Reactions   Oxycodone     Anxiety/Aggiationwith narcotics    Objective:   BP (!) 162/80   Temp 98.4 F (36.9 C) (Oral)   Ht 5' 1 (1.549 m)   Wt 211 lb (95.7 kg)   BMI 39.87 kg/m      08/13/2023    4:17 PM 08/13/2023    4:11 PM 01/08/2023   12:20 PM  Vitals with BMI  Height  5' 1 5' 1  Weight  211 lbs 200 lbs  BMI  39.89 37.81  Systolic 162 160 --  Diastolic 80 80 --     Physical Exam Vitals and nursing note reviewed.  Constitutional:      Appearance: Normal appearance. She is normal weight.  HENT:     Head: Normocephalic and atraumatic.  Cardiovascular:     Rate and Rhythm: Normal rate and regular rhythm.     Pulses: Normal pulses.     Heart sounds: Normal heart sounds.  Pulmonary:     Effort: Pulmonary effort is normal.     Breath sounds: Normal breath sounds.  Musculoskeletal:     Cervical back: Normal range of motion and neck supple.  Skin:    General: Skin is warm and dry.  Neurological:     General: No focal deficit present.     Mental Status: She is alert and oriented to person, place, and time. Mental status is at baseline.  Psychiatric:        Mood and Affect: Mood normal.        Behavior: Behavior normal.        Thought Content: Thought content normal.        Judgment: Judgment normal.     Assessment & Plan:  Hypertensive arteriosclerotic cardiovascular disease Assessment & Plan: Well controlled on Coreg  3.125mg  BID and Cozaar  75mg  daily. Taking ASA, Atorvastatin , Zetia , fish oil. Recommend heart healthy diet such as Mediterranean diet with whole grains, fruits, vegetable, fish, lean meats, nuts, and olive oil. Limit salt. Encouraged moderate walking, 3-5 times/week for 30-50 minutes each session. Aim for at least 150 minutes.week. Goal should be pace of 3 miles/hours, or walking 1.5 miles in 30 minutes. Avoid tobacco products. Avoid excess alcohol. Take  medications as prescribed and bring medications and blood pressure log with cuff to each office visit. Seek medical care for chest pain, palpitations, shortness of breath with exertion, dizziness/lightheadedness, vision changes, recurrent headaches, or swelling of extremities.    Aortic aneurysm without rupture, unspecified portion of aorta (HCC)  Hypothyroidism, unspecified type Assessment & Plan: TSH 2.88. Euthyroid. Continue Synthroid  75mcg daily.   Orders: -     Levothyroxine  Sodium; Take 1 tablet (75 mcg total) by mouth daily before breakfast.  Dispense: 100 tablet; Refill: 1  Hyperlipidemia, unspecified hyperlipidemia type Assessment & Plan: LDL 53. Continue Lipitor 10mg  daily, Zetia  10mg  daily, fish oil. Your labs showed elevated cholesterol. I recommend consuming a heart healthy diet such as Mediterranean diet or DASH diet with whole  grains, fruits, vegetable, fish, lean meats, nuts, and olive oil. Limit sweets and processed foods. I also encourage moderate intensity exercise 150 minutes weekly. This is 3-5 times weekly for 30-50 minutes each session. Goal should be pace of 3 miles/hours, or walking 1.5 miles in 30 minutes. The ASCVD Risk score (Arnett DK, et al., 2019) failed to calculate for the following reasons:   The valid total cholesterol range is 130 to 320 mg/dL    Other orders -     Ibuprofen ; Take 1 tablet (800 mg total) by mouth every 8 (eight) hours as needed for moderate pain (pain score 4-6).  Dispense: 30 tablet; Refill: 0     Follow up plan: Return in about 6 months (around 02/10/2024) for annual physical with labs 1 week prior.  Jeoffrey GORMAN Barrio, FNP

## 2023-08-13 NOTE — Assessment & Plan Note (Signed)
 TSH 2.88. Euthyroid. Continue Synthroid daily.

## 2023-08-13 NOTE — Assessment & Plan Note (Signed)
 LDL 53. Continue Lipitor 10mg  daily, Zetia  10mg  daily, fish oil. Your labs showed elevated cholesterol. I recommend consuming a heart healthy diet such as Mediterranean diet or DASH diet with whole grains, fruits, vegetable, fish, lean meats, nuts, and olive oil. Limit sweets and processed foods. I also encourage moderate intensity exercise 150 minutes weekly. This is 3-5 times weekly for 30-50 minutes each session. Goal should be pace of 3 miles/hours, or walking 1.5 miles in 30 minutes. The ASCVD Risk score (Arnett DK, et al., 2019) failed to calculate for the following reasons:   The valid total cholesterol range is 130 to 320 mg/dL

## 2023-09-16 ENCOUNTER — Institutional Professional Consult (permissible substitution): Payer: Medicare Other | Admitting: Nurse Practitioner

## 2023-09-24 ENCOUNTER — Other Ambulatory Visit: Payer: Self-pay | Admitting: Physician Assistant

## 2023-09-24 ENCOUNTER — Other Ambulatory Visit: Payer: Self-pay | Admitting: Family Medicine

## 2023-10-27 ENCOUNTER — Institutional Professional Consult (permissible substitution): Payer: Medicare Other | Admitting: Nurse Practitioner

## 2023-11-03 ENCOUNTER — Other Ambulatory Visit: Payer: Self-pay | Admitting: Family Medicine

## 2023-11-03 DIAGNOSIS — Z13820 Encounter for screening for osteoporosis: Secondary | ICD-10-CM

## 2023-11-04 ENCOUNTER — Other Ambulatory Visit: Payer: Medicare Other

## 2023-11-19 ENCOUNTER — Other Ambulatory Visit: Payer: Self-pay | Admitting: Physician Assistant

## 2023-11-19 ENCOUNTER — Other Ambulatory Visit: Payer: Self-pay | Admitting: Family Medicine

## 2023-12-21 ENCOUNTER — Institutional Professional Consult (permissible substitution): Admitting: Nurse Practitioner

## 2024-02-08 ENCOUNTER — Other Ambulatory Visit: Payer: Medicare Other

## 2024-02-10 ENCOUNTER — Other Ambulatory Visit

## 2024-02-11 ENCOUNTER — Encounter: Payer: Medicare Other | Admitting: Family Medicine

## 2024-02-11 ENCOUNTER — Other Ambulatory Visit

## 2024-02-12 LAB — CBC WITH DIFFERENTIAL/PLATELET
Absolute Lymphocytes: 2401 {cells}/uL (ref 850–3900)
Absolute Monocytes: 658 {cells}/uL (ref 200–950)
Basophils Absolute: 42 {cells}/uL (ref 0–200)
Basophils Relative: 0.6 %
Eosinophils Absolute: 259 {cells}/uL (ref 15–500)
Eosinophils Relative: 3.7 %
HCT: 40 % (ref 35.0–45.0)
Hemoglobin: 12.5 g/dL (ref 11.7–15.5)
MCH: 27.3 pg (ref 27.0–33.0)
MCHC: 31.3 g/dL — ABNORMAL LOW (ref 32.0–36.0)
MCV: 87.3 fL (ref 80.0–100.0)
MPV: 10.9 fL (ref 7.5–12.5)
Monocytes Relative: 9.4 %
Neutro Abs: 3640 {cells}/uL (ref 1500–7800)
Neutrophils Relative %: 52 %
Platelets: 302 Thousand/uL (ref 140–400)
RBC: 4.58 Million/uL (ref 3.80–5.10)
RDW: 13.6 % (ref 11.0–15.0)
Total Lymphocyte: 34.3 %
WBC: 7 Thousand/uL (ref 3.8–10.8)

## 2024-02-12 LAB — LIPID PANEL
Cholesterol: 125 mg/dL (ref <200)
HDL: 48 mg/dL — ABNORMAL LOW (ref >=50)
LDL Cholesterol (Calc): 58 mg/dL
Non-HDL Cholesterol (Calc): 77 mg/dL (ref <130)
Total CHOL/HDL Ratio: 2.6 (calc) (ref <5.0)
Triglycerides: 109 mg/dL (ref <150)

## 2024-02-12 LAB — COMPLETE METABOLIC PANEL WITHOUT GFR
AG Ratio: 1.5 (calc) (ref 1.0–2.5)
ALT: 18 U/L (ref 6–29)
AST: 23 U/L (ref 10–35)
Albumin: 4.1 g/dL (ref 3.6–5.1)
Alkaline phosphatase (APISO): 93 U/L (ref 37–153)
BUN: 22 mg/dL (ref 7–25)
CO2: 29 mmol/L (ref 20–32)
Calcium: 9.1 mg/dL (ref 8.6–10.4)
Chloride: 106 mmol/L (ref 98–110)
Creat: 0.81 mg/dL (ref 0.60–1.00)
Globulin: 2.7 g/dL (ref 1.9–3.7)
Glucose, Bld: 89 mg/dL (ref 65–99)
Potassium: 4.7 mmol/L (ref 3.5–5.3)
Sodium: 144 mmol/L (ref 135–146)
Total Bilirubin: 0.6 mg/dL (ref 0.2–1.2)
Total Protein: 6.8 g/dL (ref 6.1–8.1)

## 2024-02-12 LAB — TSH: TSH: 3.85 m[IU]/L (ref 0.40–4.50)

## 2024-02-15 ENCOUNTER — Ambulatory Visit: Payer: Self-pay | Admitting: Family Medicine

## 2024-02-17 ENCOUNTER — Encounter: Payer: Self-pay | Admitting: Family Medicine

## 2024-02-17 ENCOUNTER — Ambulatory Visit: Admitting: Family Medicine

## 2024-02-17 VITALS — BP 142/80 | HR 75 | Temp 99.2°F | Ht 61.0 in | Wt 213.0 lb

## 2024-02-17 DIAGNOSIS — I119 Hypertensive heart disease without heart failure: Secondary | ICD-10-CM | POA: Diagnosis not present

## 2024-02-17 DIAGNOSIS — E785 Hyperlipidemia, unspecified: Secondary | ICD-10-CM | POA: Diagnosis not present

## 2024-02-17 DIAGNOSIS — I6522 Occlusion and stenosis of left carotid artery: Secondary | ICD-10-CM

## 2024-02-17 DIAGNOSIS — R195 Other fecal abnormalities: Secondary | ICD-10-CM | POA: Insufficient documentation

## 2024-02-17 DIAGNOSIS — Z0001 Encounter for general adult medical examination with abnormal findings: Secondary | ICD-10-CM | POA: Diagnosis not present

## 2024-02-17 DIAGNOSIS — Z1231 Encounter for screening mammogram for malignant neoplasm of breast: Secondary | ICD-10-CM

## 2024-02-17 DIAGNOSIS — K5900 Constipation, unspecified: Secondary | ICD-10-CM | POA: Insufficient documentation

## 2024-02-17 DIAGNOSIS — I2510 Atherosclerotic heart disease of native coronary artery without angina pectoris: Secondary | ICD-10-CM

## 2024-02-17 DIAGNOSIS — R7303 Prediabetes: Secondary | ICD-10-CM

## 2024-02-17 DIAGNOSIS — E039 Hypothyroidism, unspecified: Secondary | ICD-10-CM

## 2024-02-17 DIAGNOSIS — Z0000 Encounter for general adult medical examination without abnormal findings: Secondary | ICD-10-CM

## 2024-02-17 MED ORDER — EZETIMIBE 10 MG PO TABS: 10.0000 mg | ORAL_TABLET | Freq: Every day | ORAL | 1 refills | Status: DC

## 2024-02-17 MED ORDER — LEVOTHYROXINE SODIUM 75 MCG PO TABS: 75.0000 ug | ORAL_TABLET | Freq: Every day | ORAL | 1 refills | Status: AC

## 2024-02-17 MED ORDER — FLUTICASONE PROPIONATE 50 MCG/ACT NA SUSP: 2.0000 | Freq: Every day | NASAL | 0 refills | Status: DC

## 2024-02-17 MED ORDER — ATORVASTATIN CALCIUM 10 MG PO TABS: 10.0000 mg | ORAL_TABLET | Freq: Every day | ORAL | 1 refills | Status: AC

## 2024-02-17 MED ORDER — CARVEDILOL 3.125 MG PO TABS: 3.1250 mg | ORAL_TABLET | Freq: Two times a day (BID) | ORAL | 1 refills | Status: DC

## 2024-02-17 MED ORDER — IBUPROFEN 800 MG PO TABS: 800.0000 mg | ORAL_TABLET | Freq: Three times a day (TID) | ORAL | 1 refills | Status: DC | PRN

## 2024-02-17 NOTE — Progress Notes (Signed)
 Complete physical exam  Assessment & Plan:    Routine Health Maintenance and Physical Exam Discussed health benefits of physical activity, and encouraged her to engage in regular exercise appropriate for her age and condition.  Preventative health care  Hyperlipidemia, unspecified hyperlipidemia type -     Atorvastatin  Calcium ; Take 1 tablet (10 mg total) by mouth daily.  Dispense: 90 tablet; Refill: 1 -     Ezetimibe ; Take 1 tablet (10 mg total) by mouth daily.  Dispense: 90 tablet; Refill: 1  Hypertensive arteriosclerotic cardiovascular disease -     Carvedilol ; Take 1 tablet (3.125 mg total) by mouth 2 (two) times daily.  Dispense: 90 tablet; Refill: 1 -     Ezetimibe ; Take 1 tablet (10 mg total) by mouth daily.  Dispense: 90 tablet; Refill: 1  Arteriosclerotic coronary artery disease -     Carvedilol ; Take 1 tablet (3.125 mg total) by mouth 2 (two) times daily.  Dispense: 90 tablet; Refill: 1 -     Ezetimibe ; Take 1 tablet (10 mg total) by mouth daily.  Dispense: 90 tablet; Refill: 1  Hypothyroidism, unspecified type -     Levothyroxine  Sodium; Take 1 tablet (75 mcg total) by mouth daily before breakfast.  Dispense: 90 tablet; Refill: 1  Screening mammogram for breast cancer -     3D Screening Mammogram, Left and Right; Future  Carotid artery stenosis, asymptomatic, left  Prediabetes  Other orders -     Fluticasone  Propionate; Place 2 sprays into both nostrils daily.  Dispense: 48 g; Refill: 0 -     Ibuprofen ; Take 1 tablet (800 mg total) by mouth every 8 (eight) hours as needed.  Dispense: 90 tablet; Refill: 1  Test results were reviewed and analyzed as part of the medical decision making of this visit.  Reviewed recent labs patient had done last week. Mammogram ordered. Patient will consider vaccines and let me know next time. Will discuss DEXA scan next time Recommend healthy diet i.e mediterranean/DASH diet, consistent exercise - 30 minutes 5 day per week, and gradual  weight loss.  Return in about 3 months (around 05/19/2024), or if symptoms worsen or fail to improve, for medication follow up.        Subjective:  Patient ID: Courtney Moon, female    DOB: 1955/10/15  Age: 68 y.o. MRN: [id number] Chief Complaint  Patient presents with   Annual Exam    Courtney Moon is a 68 y.o. female who presents today for a complete physical exam. EPIC not working during the office visit.  Colonoscopy-was done 2023, due 2028 DEXA scan-not yet Tetanus-past due, not in EPIC.  Shingrix-declines Pneumococcal - declines Mammogram- needs at [facility] Courtney Moon is a 68 year old female who presents for a routine follow-up of chronic medical issues, establish with me and complete physical exam. She is accompanied by her daughter.  She has resumed dancing two weeks ago after a fall last year, which had caused discomfort in her knees due to arthritis. Dancing helps alleviate her arthritis symptoms. She takes ibuprofen  800 mg as needed for pain relief. No current chest pain, shortness of breath, or other acute symptoms. Reports occasional knee pain due to arthritis.  She has a history of carotid artery disease and underwent a left carotid endarterectomy in 2022. She follows up annually with a vascular specialist in [place] and takes atorvastatin  10 mg daily and Zetia  10 mg daily for cholesterol management.  There is a delay in her annual mammogram, which was  due in September of last year, but she has scheduled an appointment for this year, will order at [facility]. She has dense breasts with some fibrosis, which occasionally causes pain.  She had a colonoscopy in 2023 and was advised to repeat it in five years due to the presence of polyps- due 2028 at [facility]  She denies any history of heart problems herself but notes a family history of heart disease and cancer. Her mother and daughter have diabetes, and both her maternal and paternal grandparents died of heart disease.  Her maternal grandmother had stomach cancer, and her paternal grandmother had pancreatic cancer.  She denies smoking, alcohol use, and drug use. She is claustrophobic and uncomfortable with MRIs or CT scans. Physical Exam VITALS: BP- 148/88 CHEST: Lungs clear to auscultation bilaterally. Results LABS   LDL: <70 mg/dL Assessment & Plan Knee Pain Chronic knee pain likely due to arthritis, exacerbated by recent dance exercises. - Recommend ibuprofen  800 mg as needed for pain. - Suggest over-the-counter Voltaren gel for knee pain. Previous history of prediabetes last year- A1C 6.2 (saw this after patient left).  Carotid Artery Disease Post-carotid endarterectomy in 2022. LDL cholesterol well-controlled under 70 with combination therapy. Discussed potential muscle pain with higher atorvastatin  doses. - Continue atorvastatin  10 mg daily. - Continue Zetia  10 mg daily. - Monitor LDL cholesterol levels to maintain under 70.  Hypertension Blood pressure slightly elevated at 148/88 mmHg. DASH recommended.   General Health Maintenance Due for mammogram. Family history of cancer and heart disease. Discussed importance of shingles vaccination due to risk of facial involvement and potential eye complications.  - Order mammogram at the breast center. - Consider shingles vaccination, involving two injections separated by 2-4 months. - Discuss pneumonia vaccination but patient declines at this time Health Maintenance  Topic Date Due   Zoster (Shingles) Vaccine (1 of 2) Never done   DEXA scan (bone density measurement)  Never done   Medicare Annual Wellness Visit  01/08/2024   Mammogram  02/14/2024   Pneumococcal Vaccine for age over 23 (1 of 2 - PCV) 08/12/2024*   Flu Shot  03/04/2024   Colon Cancer Screening  07/10/2032   Hepatitis C Screening  Completed   Hepatitis B Vaccine  Aged Out   HPV Vaccine  Aged Out   Meningitis B Vaccine  Aged Out   DTaP/Tdap/Td vaccine  Discontinued   COVID-19  Vaccine  Discontinued  *Topic was postponed. The date shown is not the original due date.    Most recent fall risk assessment:    02/17/2024    8:51 AM  Fall Risk   Falls in the past year? 0  Number falls in past yr: 0  Injury with Fall? 0     Most recent depression screenings:    02/17/2024    8:52 AM 08/13/2023    4:29 PM  PHQ 2/9 Scores  PHQ - 2 Score 2 0  PHQ- 9 Score 9 0      The ASCVD Risk score (Arnett DK, et al., 2019) failed to calculate for the following reasons:   The valid total cholesterol range is 130 to 320 mg/dL  Past Surgical History:  Procedure Laterality Date   CHOLECYSTECTOMY     ROTATOR CUFF REPAIR     Social History   Tobacco Use   Smoking status: Never   Smokeless tobacco: Never  Vaping Use   Vaping status: Never Used  Substance Use Topics   Alcohol use: Never  Drug use: Never   Social History   Socioeconomic History   Marital status: Married    Spouse name: Not on file   Number of children: Not on file   Years of education: Not on file   Highest education level: Not on file  Occupational History   Not on file  Tobacco Use   Smoking status: Never   Smokeless tobacco: Never  Vaping Use   Vaping status: Never Used  Substance and Sexual Activity   Alcohol use: Never   Drug use: Never   Sexual activity: Not on file  Other Topics Concern   Not on file  Social History Narrative   Not on file   Social Drivers of Health   Financial Resource Strain: Patient Declined (04/01/2023)   Received from [facility]   Overall Financial Resource Strain (CARDIA)    Difficulty of Paying Living Expenses: Patient declined  Food Insecurity: Patient Declined (04/01/2023)   Received from [facility]   Hunger Vital Sign    Within the past 12 months, you worried that your food would run out before you got the money to buy more.: Patient declined    Within the past 12 months, the food you bought just didn't last and you didn't have money to get  more.: Patient declined  Transportation Needs: Patient Declined (04/01/2023)   Received from [facility] - Transportation    Lack of Transportation (Medical): Patient declined    Lack of Transportation (Non-Medical): Patient declined  Physical Activity: Insufficiently Active (01/08/2023)   Exercise Vital Sign    Days of Exercise per Week: 5 days    Minutes of Exercise per Session: 20 min  Stress: Stress Concern Present (01/08/2023)   [employer] of Occupational Health - Occupational Stress Questionnaire    Feeling of Stress : To some extent  Social Connections: Socially Integrated (01/08/2023)   Social Connection and Isolation Panel    Frequency of Communication with Friends and Family: More than three times a week    Frequency of Social Gatherings with Friends and Family: Once a week    Attends Religious Services: More than 4 times per year    Active Member of [employer] or Organizations: Yes    Attends Banker Meetings: 1 to 4 times per year    Marital Status: Married  Catering manager Violence: Not At Risk (01/08/2023)   Humiliation, Afraid, Rape, and Kick questionnaire    Fear of Current or Ex-Partner: No    Emotionally Abused: No    Physically Abused: No    Sexually Abused: No   Family History  Problem Relation Age of Onset   Diabetes Mother    Heart disease Mother    Hypothyroidism Mother    Diabetes Father    Diabetes Sister    Hypothyroidism Sister    Heart disease Maternal Grandfather    Cancer Paternal Grandmother    Allergies  Allergen Reactions   Oxycodone     Anxiety/Aggiationwith narcotics     Patient Care Team: Aletha Bene, MD as PCP - General (Family Medicine) Hobart Powell BRAVO, MD (Inactive) as PCP - Cardiology (Cardiology)   Outpatient Medications Prior to Visit  Medication Sig   aspirin EC 81 MG tablet Take 81 mg by mouth daily. Swallow whole.   ASTAXANTHIN PO Take by mouth. Pt taking 12mg    B Complex-C (B-COMPLEX WITH  VITAMIN C) tablet Take 1 tablet by mouth daily.   Berberine Chloride 500 MG CAPS Take by  mouth.   Cholecalciferol (VITAMIN D3) 25 MCG (1000 UT) CAPS Take 1 capsule (1,000 Units total) by mouth daily.   fish oil-omega-3 fatty acids  1000 MG capsule Take 2 capsules (2 g total) by mouth daily.   losartan  (COZAAR ) 25 MG tablet TAKE 1 TABLET BY MOUTH DAILY  WITH 50MG  TABLET FOR A TOTAL  DAILY DOSE OF 75MG    losartan  (COZAAR ) 50 MG tablet TAKE 1 TABLET BY MOUTH DAILY  ALONG WITH THE 25 MG TABLET TO  EQUAL 75 MG DAILY   pantoprazole (PROTONIX) 40 MG tablet Take 40 mg by mouth daily.   [DISCONTINUED] atorvastatin  (LIPITOR) 10 MG tablet TAKE 1 TABLET BY MOUTH DAILY   [DISCONTINUED] carvedilol  (COREG ) 3.125 MG tablet TAKE 1 TABLET BY MOUTH TWICE  DAILY   [DISCONTINUED] ezetimibe  (ZETIA ) 10 MG tablet TAKE 1 TABLET BY MOUTH DAILY   [DISCONTINUED] fluticasone  (FLONASE ) 50 MCG/ACT nasal spray USE 2 SPRAYS IN BOTH NOSTRILS  DAILY   [DISCONTINUED] ibuprofen  (ADVIL ) 800 MG tablet TAKE 1 TABLET BY MOUTH EVERY 8  HOURS AS NEEDED FOR MODERATE  PAIN (PAIN SCORE 4-6)   [DISCONTINUED] levothyroxine  (SYNTHROID ) 75 MCG tablet Take 1 tablet (75 mcg total) by mouth daily before breakfast.   No facility-administered medications prior to visit.    ROS     Objective:    BP (!) 142/80   Pulse 75   Temp 99.2 F (37.3 C)   Ht 5' 1 (1.549 m)   Wt 213 lb (96.6 kg)   SpO2 99%   BMI 40.25 kg/m  BP Readings from Last 3 Encounters:  02/17/24 (!) 142/80  08/13/23 (!) 162/80  12/18/22 138/68   Wt Readings from Last 3 Encounters:  02/17/24 213 lb (96.6 kg)  08/13/23 211 lb (95.7 kg)  01/08/23 200 lb (90.7 kg)    Physical Exam Vitals and nursing note reviewed.  Constitutional:      General: She is not in acute distress.    Appearance: Normal appearance.     Comments: Comes in with her daughter  HENT:     Head: Normocephalic.     Right Ear: Tympanic membrane, ear canal and external ear normal.     Left Ear:  Tympanic membrane, ear canal and external ear normal.  Eyes:     Extraocular Movements: Extraocular movements intact.     Pupils: Pupils are equal, round, and reactive to light.  Neck:     Vascular: No carotid bruit.     Comments: Surgical scar noted left side of neck Cardiovascular:     Rate and Rhythm: Normal rate and regular rhythm.     Heart sounds: Normal heart sounds.  Pulmonary:     Effort: Pulmonary effort is normal.     Breath sounds: Normal breath sounds. No wheezing or rhonchi.  Chest:  Breasts:    Right: Normal. No inverted nipple, mass, nipple discharge or tenderness.     Left: Tenderness present. No inverted nipple, mass or nipple discharge.     Comments: Slight tenderness over left outer quadrant of breast.  Abdominal:     Tenderness: There is no abdominal tenderness. There is no guarding or rebound.  Musculoskeletal:     Right knee: No swelling. Normal range of motion.     Left knee: No swelling. Normal range of motion.     Right lower leg: No edema.     Left lower leg: No edema.  Skin:    Findings: No lesion or rash.  Neurological:  General: No focal deficit present.     Mental Status: She is alert and oriented to person, place, and time.     Cranial Nerves: Cranial nerves 2-12 are intact. No cranial nerve deficit.     Coordination: Coordination is intact. Romberg sign negative. Coordination normal. Finger-Nose-Finger Test normal.     Gait: Tandem walk abnormal.  Psychiatric:        Mood and Affect: Mood normal.        Behavior: Behavior normal.        Thought Content: Thought content normal.        Judgment: Judgment normal.      No results found for any visits on 02/17/24.      Connie Emperor, MD

## 2024-02-22 ENCOUNTER — Other Ambulatory Visit: Payer: Self-pay | Admitting: Family Medicine

## 2024-02-22 ENCOUNTER — Encounter: Payer: Self-pay | Admitting: Family Medicine

## 2024-02-22 ENCOUNTER — Other Ambulatory Visit: Payer: Self-pay

## 2024-02-22 ENCOUNTER — Telehealth: Payer: Self-pay | Admitting: Physician Assistant

## 2024-02-22 DIAGNOSIS — Z1231 Encounter for screening mammogram for malignant neoplasm of breast: Secondary | ICD-10-CM

## 2024-02-22 DIAGNOSIS — I719 Aortic aneurysm of unspecified site, without rupture: Secondary | ICD-10-CM

## 2024-02-22 DIAGNOSIS — N644 Mastodynia: Secondary | ICD-10-CM

## 2024-02-22 MED ORDER — LOSARTAN POTASSIUM 25 MG PO TABS: 25.0000 mg | ORAL_TABLET | Freq: Every day | ORAL | 0 refills | Status: DC

## 2024-02-22 MED ORDER — LOSARTAN POTASSIUM 50 MG PO TABS: 50.0000 mg | ORAL_TABLET | Freq: Every day | ORAL | 0 refills | Status: DC

## 2024-02-22 NOTE — Telephone Encounter (Signed)
 *  STAT* If patient is at the pharmacy, call can be transferred to refill team.   1. Which medications need to be refilled? (please list name of each medication and dose if known)   losartan  (COZAAR ) 25 MG tablet  losartan  (COZAAR ) 50 MG tablet    2. Would you like to learn more about the convenience, safety, & potential cost savings by using the [facility]? No      3. Are you open to using the [facility] (Type [facility]. ). No    4. Which pharmacy/location (including street and city if local pharmacy) is medication to be sent to? [facility], [address] [place], [place], [place] [address] Phone: [phone]   5. Do they need a 30 day or 90 day supply? 90 days   Pt made an appt with Orren Fabry on 04/07/24 at 3:35 pm

## 2024-02-22 NOTE — Telephone Encounter (Signed)
 Refills has been sent to the pharmacy.

## 2024-02-22 NOTE — Telephone Encounter (Signed)
  Pt's daughter said, pt normally get yearly echo before seeing cardiologist. They want to know if pt needs to get on this year, if yes need echo order to schedule

## 2024-02-23 NOTE — Telephone Encounter (Signed)
 Echo ordered.

## 2024-02-24 ENCOUNTER — Telehealth: Payer: Self-pay | Admitting: *Deleted

## 2024-02-24 NOTE — Telephone Encounter (Signed)
 A message was left [id number] her test.

## 2024-02-25 ENCOUNTER — Ambulatory Visit: Admitting: Nurse Practitioner

## 2024-03-01 ENCOUNTER — Other Ambulatory Visit: Payer: Self-pay | Admitting: Family Medicine

## 2024-03-01 DIAGNOSIS — N644 Mastodynia: Secondary | ICD-10-CM

## 2024-03-07 ENCOUNTER — Encounter

## 2024-03-07 ENCOUNTER — Other Ambulatory Visit

## 2024-03-22 ENCOUNTER — Ambulatory Visit (HOSPITAL_COMMUNITY): Admission: RE | Admit: 2024-03-22 | Source: Ambulatory Visit

## 2024-04-07 ENCOUNTER — Ambulatory Visit: Admitting: Physician Assistant

## 2024-04-22 ENCOUNTER — Other Ambulatory Visit: Payer: Self-pay | Admitting: Family Medicine

## 2024-04-25 NOTE — Telephone Encounter (Signed)
 Requested Prescriptions  Pending Prescriptions Disp Refills   fluticasone  (FLONASE ) 50 MCG/ACT nasal spray [Pharmacy Med Name: Fluticasone  Propionate 50 MCG/ACT Nasal Suspension] 48 g 0    Sig: Use 2 spray(s) in each nostril once daily     Ear, Nose, and Throat: Nasal Preparations - Corticosteroids Passed - 04/25/2024 10:43 AM      Passed - Valid encounter within last 12 months    Recent Outpatient Visits           2 months ago Preventative health care   [place] [facility] Aletha Bene, MD   8 months ago Hypertensive arteriosclerotic cardiovascular disease   [place] [facility] Kayla Jeoffrey RAMAN, FNP   1 year ago Hypothyroidism, unspecified type   [place] [facility] Kayla Jeoffrey RAMAN, FNP   1 year ago Hypertensive arteriosclerotic cardiovascular disease   [place] [facility] Kayla Jeoffrey RAMAN, FNP

## 2024-04-28 ENCOUNTER — Other Ambulatory Visit

## 2024-04-28 ENCOUNTER — Encounter

## 2024-04-29 ENCOUNTER — Telehealth (HOSPITAL_COMMUNITY): Payer: Self-pay | Admitting: Physician Assistant

## 2024-04-29 NOTE — Telephone Encounter (Signed)
 Patient cancelled echocardiogram VIA Reminder call system.  04/29/24- Auto Confirm Status: TEXT NO-pt cancelled VIA tel reminder system.  Order will be removed from the echo WQ and if patient calls back we will reinstate the order. Thank you.

## 2024-05-02 ENCOUNTER — Ambulatory Visit (HOSPITAL_COMMUNITY)

## 2024-05-26 ENCOUNTER — Other Ambulatory Visit: Payer: Self-pay | Admitting: Physician Assistant

## 2024-05-30 ENCOUNTER — Ambulatory Visit: Admitting: Family Medicine

## 2024-05-31 ENCOUNTER — Telehealth: Payer: Self-pay | Admitting: Physician Assistant

## 2024-05-31 NOTE — Telephone Encounter (Signed)
*  STAT* If patient is at the pharmacy, call can be transferred to refill team.   1. Which medications need to be refilled? (please list name of each medication and dose if known)   losartan  (COZAAR ) 25 MG tablet Take 1 tablet (25 mg total) by mouth daily. KEEP OV.   losartan  (COZAAR ) 50 MG tablet Take 1 tablet (50 mg total) by mouth daily. KEEP OV.     4. Which pharmacy/location (including street and city if local pharmacy) is medication to be sent to?  [facility] - [place]  - [address]     5. Do they need a 30 day or 90 day supply? 90    Pt scheduled 12/4

## 2024-06-01 MED ORDER — LOSARTAN POTASSIUM 50 MG PO TABS: 50.0000 mg | ORAL_TABLET | Freq: Every day | ORAL | 0 refills | Status: DC

## 2024-06-01 MED ORDER — LOSARTAN POTASSIUM 25 MG PO TABS: 25.0000 mg | ORAL_TABLET | Freq: Every day | ORAL | 0 refills | Status: DC

## 2024-06-01 NOTE — Telephone Encounter (Signed)
 Pt scheduled 07/07/24 with Orren Fabry, NP.  90 day refill sent to [facility].

## 2024-06-24 ENCOUNTER — Other Ambulatory Visit: Payer: Self-pay | Admitting: Family Medicine

## 2024-06-24 DIAGNOSIS — I2510 Atherosclerotic heart disease of native coronary artery without angina pectoris: Secondary | ICD-10-CM

## 2024-06-24 DIAGNOSIS — I119 Hypertensive heart disease without heart failure: Secondary | ICD-10-CM

## 2024-06-24 NOTE — Telephone Encounter (Unsigned)
 Copied from CRM [id number]. Topic: Clinical - Medication Refill >> Jun 24, 2024  1:08 PM Teressa P wrote: Medication: Carvedilol  3.125 mg  Has the patient contacted their pharmacy? Yes (Agent: If no, request that the patient contact the pharmacy for the refill. If patient does not wish to contact the pharmacy document the reason why and proceed with request.) (Agent: If yes, when and what did the pharmacy advise?)  This is the patient's preferred pharmacy:   [facility] - [place] ([place]), [place] - [address] [place] ([place]) [place] [address] Phone: [phone] Fax: [phone]  Is this the correct pharmacy for this prescription? Yes If no, delete pharmacy and type the correct one.   Has the prescription been filled recently? Yes  Is the patient out of the medication? Yes  Has the patient been seen for an appointment in the last year OR does the patient have an upcoming appointment? Yes  Can we respond through MyChart? No  Agent: Please be advised that Rx refills may take up to 3 business days. We ask that you follow-up with your pharmacy.

## 2024-06-26 NOTE — Telephone Encounter (Signed)
 Rx 02/17/24 #90 1RF- too soon Requested Prescriptions  Pending Prescriptions Disp Refills   carvedilol  (COREG ) 3.125 MG tablet 90 tablet 1    Sig: Take 1 tablet (3.125 mg total) by mouth 2 (two) times daily.     Cardiovascular: Beta Blockers 3 Failed - 06/26/2024 11:35 AM      Failed - Last BP in normal range    BP Readings from Last 1 Encounters:  02/17/24 (!) 142/80         Passed - Cr in normal range and within 360 days    Creat  Date Value Ref Range Status  02/11/2024 0.81 0.60 - 1.00 mg/dL Final         Passed - AST in normal range and within 360 days    AST  Date Value Ref Range Status  02/11/2024 23 10 - 35 U/L Final         Passed - ALT in normal range and within 360 days    ALT  Date Value Ref Range Status  02/11/2024 18 6 - 29 U/L Final         Passed - Last Heart Rate in normal range    Pulse Readings from Last 1 Encounters:  02/17/24 75         Passed - Valid encounter within last 6 months    Recent Outpatient Visits           4 months ago Preventative health care   [place] [facility] Aletha Bene, MD   10 months ago Hypertensive arteriosclerotic cardiovascular disease   [place] [facility] Kayla Jeoffrey RAMAN, FNP   1 year ago Hypothyroidism, unspecified type   [place] [facility] Kayla Jeoffrey RAMAN, FNP   1 year ago Hypertensive arteriosclerotic cardiovascular disease   [place] [facility] Kayla Jeoffrey RAMAN, FNP       Future Appointments             In 1 month Lucien, Orren SAILOR, PA-C [facility] [employer] [facility] [employer]. [facility] [employer], [facility]

## 2024-06-27 ENCOUNTER — Other Ambulatory Visit: Payer: Self-pay

## 2024-06-27 ENCOUNTER — Telehealth: Payer: Self-pay

## 2024-06-27 ENCOUNTER — Ambulatory Visit (HOSPITAL_COMMUNITY)

## 2024-06-27 DIAGNOSIS — I2510 Atherosclerotic heart disease of native coronary artery without angina pectoris: Secondary | ICD-10-CM

## 2024-06-27 DIAGNOSIS — I119 Hypertensive heart disease without heart failure: Secondary | ICD-10-CM

## 2024-06-27 MED ORDER — CARVEDILOL 3.125 MG PO TABS
3.1250 mg | ORAL_TABLET | Freq: Two times a day (BID) | ORAL | 2 refills | Status: DC
Start: 2024-06-27 — End: 2024-06-28

## 2024-06-27 MED ORDER — CARVEDILOL 3.125 MG PO TABS: 3.1250 mg | ORAL_TABLET | Freq: Two times a day (BID) | ORAL | 0 refills | Status: DC

## 2024-06-27 NOTE — Telephone Encounter (Signed)
 Copied from CRM [id number]. Topic: Clinical - Prescription Issue >> Jun 27, 2024 11:14 AM Darshell M wrote: Reason for CRM: Patient's Carvedilol  prescription was not denied. No reason given. Patient is out of the medication and hasn't taken any today. Patient CB# [phone], Daughter Elanor Cale) [phone]

## 2024-06-28 ENCOUNTER — Telehealth: Payer: Self-pay | Admitting: Family Medicine

## 2024-06-28 ENCOUNTER — Other Ambulatory Visit: Payer: Self-pay

## 2024-06-28 DIAGNOSIS — I119 Hypertensive heart disease without heart failure: Secondary | ICD-10-CM

## 2024-06-28 DIAGNOSIS — I2510 Atherosclerotic heart disease of native coronary artery without angina pectoris: Secondary | ICD-10-CM

## 2024-06-28 MED ORDER — CARVEDILOL 3.125 MG PO TABS: 3.1250 mg | ORAL_TABLET | Freq: Two times a day (BID) | ORAL | 2 refills | Status: AC

## 2024-06-28 NOTE — Telephone Encounter (Signed)
 Copied from CRM [id number]. Topic: Clinical - Prescription Issue >> Jun 27, 2024 11:14 AM Darshell M wrote: Reason for CRM: Patient's Carvedilol  prescription was not denied. No reason given. Patient is out of the medication and hasn't taken any today. Patient CB# [phone], Daughter Guila Owensby) [phone] >> Jun 28, 2024  3:17 PM Jeoffrey H wrote: Patient called and stated she had been trying to get her Carvedilol  refilled and she was given a 14 day supply however, she checked my chart and saw were a nurse said they would send her refill over to the [facility]. Patient stated she did not have [facility] anymore and will only be using the [facility] [address]. Patient wants prescription resent to the correct pharmacy.   [place] [phone], Daughter Ericka Marcellus) [phone]

## 2024-07-07 ENCOUNTER — Ambulatory Visit: Admitting: Physician Assistant

## 2024-07-08 ENCOUNTER — Other Ambulatory Visit

## 2024-07-22 ENCOUNTER — Ambulatory Visit: Admitting: Family Medicine

## 2024-07-26 ENCOUNTER — Ambulatory Visit (HOSPITAL_COMMUNITY)
Admission: RE | Admit: 2024-07-26 | Discharge: 2024-07-26 | Disposition: A | Discharge status: Home / Self Care | Source: Ambulatory Visit | Attending: Physician Assistant | Admitting: Physician Assistant

## 2024-07-26 DIAGNOSIS — I719 Aortic aneurysm of unspecified site, without rupture: Secondary | ICD-10-CM | POA: Insufficient documentation

## 2024-07-26 DIAGNOSIS — E669 Obesity, unspecified: Secondary | ICD-10-CM | POA: Insufficient documentation

## 2024-07-26 DIAGNOSIS — E785 Hyperlipidemia, unspecified: Secondary | ICD-10-CM | POA: Insufficient documentation

## 2024-07-26 DIAGNOSIS — I10 Essential (primary) hypertension: Secondary | ICD-10-CM | POA: Insufficient documentation

## 2024-07-26 DIAGNOSIS — I7121 Aneurysm of the ascending aorta, without rupture: Secondary | ICD-10-CM | POA: Insufficient documentation

## 2024-07-26 DIAGNOSIS — I340 Nonrheumatic mitral (valve) insufficiency: Secondary | ICD-10-CM | POA: Insufficient documentation

## 2024-07-27 LAB — ECHOCARDIOGRAM COMPLETE
Area-P 1/2: 2.7 cm2
S' Lateral: 2.5 cm

## 2024-08-01 ENCOUNTER — Ambulatory Visit: Payer: Self-pay | Admitting: Physician Assistant

## 2024-08-01 NOTE — Progress Notes (Signed)
 Patient called back and I have discussed the ECHOCARDIOGRAM results with her and patient is aware that she has an upcoming appointment and has also asked that Needs Interpreter be removed from her chart.

## 2024-08-03 NOTE — Progress Notes (Unsigned)
 "  Office Visit    Patient Name: Courtney Moon Date of Encounter: 08/03/2024  PCP:  Aletha Bene, MD   [facility]  Cardiologist:  Powell FORBES Sorrow, MD (Inactive)  Advanced Practice Provider:  No care team member to display Electrophysiologist:  None   HPI    Courtney Moon is a 68 y.o. female with a past medical history of hypertension, hyperlipidemia, carotid artery disease, and orthostatic hypotension presents today for follow-up visit.  Patient had COVID 08/2020.  She got very agitated and confused.  EMS was called and she was taken to [facility] where EKG was normal, troponin negative, CXR without acute pathology.  Discharged home.  She was last seen by Dr. Sorrow March 2022 and had suffered with significant anxiety, shortness of breath, and episodes of chest pain.  Her chest discomfort and breathing symptoms were usually worse with anxiety but could occur with exertion as well.  Notably, she was followed by cardiology in [place] where stress test was performed in 2019 which was normal.  TTE in 2019 with normal LVEF 56%, mild TR.  No known coronary disease.  She also describes worsening dizziness.  She underwent carotid ultrasound which showed greater than 70% narrowing of the left ICA.  Plan to see vascular surgery.  Also has left leg numbness and was planning to see neurology for further management.  I saw her last April 2024, she tells me that she is having tachycardia at night which she thinks is related to her anxiety. She tried a couple of anxiety medications and they made things worse. She  Tachycardia at night. Anxiety episodes. Herbal supplements. She tried some medications from her PCP. She uses essential oils and Tranquilene for her anxiety. Her BP at home is well controlled, 133/80. She tells me she has white coat syndrome. She otherwise does not have any chest pain or SOB. Discussed monitoring BP at home and sending me those values.  Reports no  shortness of breath nor dyspnea on exertion. Reports no chest pain, pressure, or tightness. No edema, orthopnea, PND. Reports no palpitations.  Today, she***   Past Medical History    Past Medical History:  Diagnosis Date   Anxiety    Arthritis    Bradycardia    Chronic UTI    Gastritis    Hearing loss    HLD (hyperlipidemia)    Hypercholesteremia    Hypertension    Orthostatic hypertension    Thyroid  disorder    Past Surgical History:  Procedure Laterality Date   CHOLECYSTECTOMY     ROTATOR CUFF REPAIR      Allergies  Allergies  Allergen Reactions   Oxycodone     Anxiety/Aggiationwith narcotics    EKGs/Labs/Other Studies Reviewed:   The following studies were reviewed today:  Echo 03/07/22 IMPRESSIONS     1. Left ventricular ejection fraction, by estimation, is 60 to 65%. Left  ventricular ejection fraction by 3D volume is 62 %. The left ventricle has  normal function. The left ventricle has no regional wall motion  abnormalities. There is mild left  ventricular hypertrophy. Left ventricular diastolic parameters are  consistent with Grade I diastolic dysfunction (impaired relaxation). The  average left ventricular global longitudinal strain is -25.1 %. The global  longitudinal strain is normal.   2. Right ventricular systolic function is normal. The right ventricular  size is normal. There is normal pulmonary artery systolic pressure.   3. Left atrial size was mildly dilated.   4.  The mitral valve is normal in structure. Mild mitral valve  regurgitation. Moderate mitral annular calcification.   5. The aortic valve is tricuspid. There is mild calcification of the  aortic valve. There is mild thickening of the aortic valve. Aortic valve  regurgitation is not visualized. Aortic valve sclerosis/calcification is  present, without any evidence of  aortic stenosis.   6. Aortic dilatation noted. There is mild dilatation of the ascending  aorta, measuring 40 mm.    7. The inferior vena cava is normal in size with greater than 50%  respiratory variability, suggesting right atrial pressure of 3 mmHg.   8. Cannot exclude a small PFO.   Comparison(s): 02/13/21 EF 60-65%. Ascending aorta 39mm. Overall, no  significant change from prior.   EKG:  EKG is  ordered today.  The ekg ordered today demonstrates NSR rate 63 bpm  Recent Labs: 02/11/2024: ALT 18; BUN 22; Creat 0.81; Hemoglobin 12.5; Platelets 302; Potassium 4.7; Sodium 144; TSH 3.85  Recent Lipid Panel    Component Value Date/Time   CHOL 125 02/11/2024 1157   CHOL 113 05/24/2021 0931   TRIG 109 02/11/2024 1157   HDL 48 (L) 02/11/2024 1157   HDL 46 05/24/2021 0931   CHOLHDL 2.6 02/11/2024 1157   LDLCALC 58 02/11/2024 1157     Home Medications   No outpatient medications have been marked as taking for the 08/05/24 encounter (Appointment) with Lucien Orren SAILOR, PA-C.     Review of Systems      All other systems reviewed and are otherwise negative except as noted above.  Physical Exam    VS:  There were no vitals taken for this visit. , BMI There is no height or weight on file to calculate BMI.  Wt Readings from Last 3 Encounters:  02/17/24 213 lb (96.6 kg)  08/13/23 211 lb (95.7 kg)  01/08/23 200 lb (90.7 kg)     GEN: Well nourished, well developed, in no acute distress. HEENT: normal. Neck: Supple, no JVD, carotid bruits, or masses. Cardiac: RRR, no murmurs, rubs, or gallops. No clubbing, cyanosis, edema.  Radials/PT 2+ and equal bilaterally.  Respiratory:  Respirations regular and unlabored, clear to auscultation bilaterally. GI: Soft, nontender, nondistended. MS: No deformity or atrophy. Skin: Warm and dry, no rash. Neuro:  Strength and sensation are intact. Psych: Normal affect.  Assessment & Plan     HTN -high today but has been well controlled at home -continue current dose of losartan  75mg  daily -continue coreg  3.125mg  BID -continue low sodium diet -continue to  monitor at home daily x 2 weeks and send me those values  Aortic aneurysm -40mm when we last checked Aug 2023 -will plan for f/u echo Aug 2024  HLD -labs recently, LDL 55  Orthostatic hypotension/L carotid artery stenosis -better with the lightheadedness and now using essential oils -had surgery on her left carotid artery  Left leg numbness -she was told she would need a full body MRI and with her anxiety she decided against this -she has been using essential oils and it helps      Disposition: Follow up 1 year with Powell FORBES Sorrow, MD (Inactive) or APP.  Signed, Orren SAILOR Lucien, PA-C 08/03/2024, 8:29 AM [facility]  "

## 2024-08-05 ENCOUNTER — Ambulatory Visit: Attending: Physician Assistant | Admitting: Physician Assistant

## 2024-08-05 VITALS — BP 170/88 | HR 68 | Ht 60.0 in | Wt 212.0 lb

## 2024-08-05 DIAGNOSIS — I10 Essential (primary) hypertension: Secondary | ICD-10-CM

## 2024-08-05 DIAGNOSIS — G4719 Other hypersomnia: Secondary | ICD-10-CM

## 2024-08-05 DIAGNOSIS — E785 Hyperlipidemia, unspecified: Secondary | ICD-10-CM

## 2024-08-05 DIAGNOSIS — I719 Aortic aneurysm of unspecified site, without rupture: Secondary | ICD-10-CM

## 2024-08-05 DIAGNOSIS — R0609 Other forms of dyspnea: Secondary | ICD-10-CM

## 2024-08-05 DIAGNOSIS — I119 Hypertensive heart disease without heart failure: Secondary | ICD-10-CM | POA: Diagnosis not present

## 2024-08-05 DIAGNOSIS — R200 Anesthesia of skin: Secondary | ICD-10-CM | POA: Diagnosis not present

## 2024-08-05 MED ORDER — LOSARTAN POTASSIUM 50 MG PO TABS: 50.0000 mg | ORAL_TABLET | Freq: Every day | ORAL | 1 refills | Status: DC

## 2024-08-05 MED ORDER — LOSARTAN POTASSIUM 25 MG PO TABS: 25.0000 mg | ORAL_TABLET | Freq: Every day | ORAL | 1 refills | Status: DC

## 2024-08-05 NOTE — Patient Instructions (Addendum)
 Medication Instructions:  You can take an extra 25mg  of Losartan  once a day as needed; if you have to take more than 3 days in one week please contact the office *If you need a refill on your cardiac medications before your next appointment, please call your pharmacy*  Lab Work: None Ordered If you have labs (blood work) drawn today and your tests are completely normal, you will receive your results only by: MyChart Message (if you have MyChart) OR A paper copy in the mail If you have any lab test that is abnormal or we need to change your treatment, we will call you to review the results.  Testing/Procedures: [place] Sleep Study   Follow-Up: At [facility], you and your health needs are our priority.  As part of our continuing mission to provide you with exceptional heart care, our providers are all part of one team.  This team includes your primary Cardiologist (physician) and Advanced Practice Providers or APPs (Physician Assistants and Nurse Practitioners) who all work together to provide you with the care you need, when you need it.  Your next appointment:   6 month(s)  Provider:   Orren Fabry, PA  We recommend signing up for the patient portal called MyChart.  Sign up information is provided on this After Visit Summary.  MyChart is used to connect with patients for Virtual Visits (Telemedicine).  Patients are able to view lab/test results, encounter notes, upcoming appointments, etc.  Non-urgent messages can be sent to your provider as well.   To learn more about what you can do with MyChart, go to [email or web address].   Other Instructions

## 2024-08-19 ENCOUNTER — Other Ambulatory Visit: Payer: Self-pay | Admitting: Family Medicine

## 2024-08-19 DIAGNOSIS — E785 Hyperlipidemia, unspecified: Secondary | ICD-10-CM

## 2024-08-19 DIAGNOSIS — I119 Hypertensive heart disease without heart failure: Secondary | ICD-10-CM

## 2024-08-19 DIAGNOSIS — I2510 Atherosclerotic heart disease of native coronary artery without angina pectoris: Secondary | ICD-10-CM

## 2024-08-24 ENCOUNTER — Other Ambulatory Visit: Payer: Self-pay | Admitting: Family Medicine

## 2024-08-25 ENCOUNTER — Encounter: Payer: Self-pay | Admitting: Physician Assistant

## 2024-08-25 ENCOUNTER — Other Ambulatory Visit: Payer: Self-pay | Admitting: Physician Assistant

## 2024-08-31 MED ORDER — LOSARTAN POTASSIUM 25 MG PO TABS: 25.0000 mg | ORAL_TABLET | Freq: Every day | ORAL | 0 refills | Status: AC | PRN

## 2024-08-31 MED ORDER — LOSARTAN POTASSIUM 50 MG PO TABS: 75.0000 mg | ORAL_TABLET | Freq: Every day | ORAL | 2 refills | Status: AC

## 2024-08-31 NOTE — Addendum Note (Signed)
 Addended by: JOSHUA ANDREZ PARAS on: 08/31/2024 02:53 PM   Modules accepted: Orders

## 2024-09-01 ENCOUNTER — Ambulatory Visit: Admitting: Family Medicine

## 2024-09-19 ENCOUNTER — Ambulatory Visit: Admitting: Family Medicine
